# Patient Record
Sex: Female | Born: 1960 | Race: White | Hispanic: Yes | Marital: Married | Smoking: Former smoker
Health system: Southern US, Community
[De-identification: ages and names within clinical notes are randomized; demographics above are authoritative.]

## PROBLEM LIST (undated history)

## (undated) DIAGNOSIS — N898 Other specified noninflammatory disorders of vagina: Secondary | ICD-10-CM

## (undated) DIAGNOSIS — I10 Essential (primary) hypertension: Secondary | ICD-10-CM

## (undated) DIAGNOSIS — Z0001 Encounter for general adult medical examination with abnormal findings: Secondary | ICD-10-CM

## (undated) DIAGNOSIS — J302 Other seasonal allergic rhinitis: Secondary | ICD-10-CM

## (undated) DIAGNOSIS — R5383 Other fatigue: Secondary | ICD-10-CM

## (undated) DIAGNOSIS — I471 Supraventricular tachycardia, unspecified: Secondary | ICD-10-CM

## (undated) DIAGNOSIS — E039 Hypothyroidism, unspecified: Secondary | ICD-10-CM

## (undated) DIAGNOSIS — F329 Major depressive disorder, single episode, unspecified: Secondary | ICD-10-CM

## (undated) DIAGNOSIS — R002 Palpitations: Secondary | ICD-10-CM

## (undated) DIAGNOSIS — Z6838 Body mass index (BMI) 38.0-38.9, adult: Secondary | ICD-10-CM

## (undated) DIAGNOSIS — F988 Other specified behavioral and emotional disorders with onset usually occurring in childhood and adolescence: Secondary | ICD-10-CM

## (undated) DIAGNOSIS — R748 Abnormal levels of other serum enzymes: Secondary | ICD-10-CM

## (undated) DIAGNOSIS — R1310 Dysphagia, unspecified: Secondary | ICD-10-CM

## (undated) DIAGNOSIS — R070 Pain in throat: Secondary | ICD-10-CM

## (undated) DIAGNOSIS — I2510 Atherosclerotic heart disease of native coronary artery without angina pectoris: Secondary | ICD-10-CM

## (undated) DIAGNOSIS — K219 Gastro-esophageal reflux disease without esophagitis: Secondary | ICD-10-CM

## (undated) DIAGNOSIS — E785 Hyperlipidemia, unspecified: Secondary | ICD-10-CM

## (undated) DIAGNOSIS — L237 Allergic contact dermatitis due to plants, except food: Secondary | ICD-10-CM

## (undated) DIAGNOSIS — R1013 Epigastric pain: Secondary | ICD-10-CM

## (undated) DIAGNOSIS — Z6837 Body mass index (BMI) 37.0-37.9, adult: Secondary | ICD-10-CM

## (undated) DIAGNOSIS — K5900 Constipation, unspecified: Secondary | ICD-10-CM

## (undated) DIAGNOSIS — Z8669 Personal history of other diseases of the nervous system and sense organs: Secondary | ICD-10-CM

## (undated) DIAGNOSIS — R600 Localized edema: Secondary | ICD-10-CM

## (undated) DIAGNOSIS — R5381 Other malaise: Secondary | ICD-10-CM

## (undated) DIAGNOSIS — R300 Dysuria: Secondary | ICD-10-CM

## (undated) DIAGNOSIS — B009 Herpesviral infection, unspecified: Secondary | ICD-10-CM

## (undated) DIAGNOSIS — R32 Unspecified urinary incontinence: Secondary | ICD-10-CM

## (undated) HISTORY — DX: Personal history of other diseases of the nervous system and sense organs: Z86.69

## (undated) HISTORY — DX: Herpesviral infection, unspecified: B00.9

## (undated) HISTORY — DX: Abnormal levels of other serum enzymes: R74.8

## (undated) HISTORY — DX: Other specified behavioral and emotional disorders with onset usually occurring in childhood and adolescence: F98.8

## (undated) HISTORY — DX: Body mass index (BMI) 38.0-38.9, adult: Z68.38

## (undated) HISTORY — DX: Unspecified urinary incontinence: R32

## (undated) HISTORY — DX: Palpitations: R00.2

## (undated) HISTORY — DX: Dysphagia, unspecified: R13.10

## (undated) HISTORY — DX: Other seasonal allergic rhinitis: J30.2

## (undated) HISTORY — DX: Hyperlipidemia, unspecified: E78.5

## (undated) HISTORY — DX: Allergic contact dermatitis due to plants, except food: L23.7

## (undated) HISTORY — DX: Gastro-esophageal reflux disease without esophagitis: K21.9

## (undated) HISTORY — DX: Epigastric pain: R10.13

## (undated) HISTORY — DX: Supraventricular tachycardia, unspecified: I47.10

## (undated) HISTORY — PX: PALATE SURGERY: SHX729

## (undated) HISTORY — DX: Atherosclerotic heart disease of native coronary artery without angina pectoris: I25.10

## (undated) HISTORY — DX: Other malaise: R53.81

## (undated) HISTORY — DX: Body mass index (BMI) 37.0-37.9, adult: Z68.37

## (undated) HISTORY — DX: Supraventricular tachycardia: I47.1

## (undated) HISTORY — DX: Other specified noninflammatory disorders of vagina: N89.8

## (undated) HISTORY — DX: Pain in throat: R07.0

## (undated) HISTORY — DX: Other fatigue: R53.83

## (undated) HISTORY — DX: Encounter for general adult medical examination with abnormal findings: Z00.01

## (undated) HISTORY — DX: Essential (primary) hypertension: I10

## (undated) HISTORY — DX: Dysuria: R30.0

## (undated) HISTORY — PX: APPENDECTOMY: SHX54

## (undated) HISTORY — DX: Constipation, unspecified: K59.00

## (undated) HISTORY — DX: Major depressive disorder, single episode, unspecified: F32.9

## (undated) HISTORY — DX: Hypothyroidism, unspecified: E03.9

## (undated) HISTORY — DX: Localized edema: R60.0

---

## 1997-11-16 ENCOUNTER — Ambulatory Visit (HOSPITAL_BASED_OUTPATIENT_CLINIC_OR_DEPARTMENT_OTHER): Admission: RE | Admit: 1997-11-16 | Discharge: 1997-11-16 | Payer: Self-pay | Admitting: Urology

## 2001-04-18 ENCOUNTER — Other Ambulatory Visit: Admission: RE | Admit: 2001-04-18 | Discharge: 2001-04-18 | Payer: Self-pay | Admitting: Family Medicine

## 2004-09-19 ENCOUNTER — Ambulatory Visit (HOSPITAL_COMMUNITY): Admission: RE | Admit: 2004-09-19 | Discharge: 2004-09-19 | Payer: Self-pay | Admitting: Family Medicine

## 2005-07-02 ENCOUNTER — Ambulatory Visit (HOSPITAL_COMMUNITY): Admission: RE | Admit: 2005-07-02 | Discharge: 2005-07-02 | Payer: Self-pay | Admitting: Family Medicine

## 2005-07-05 ENCOUNTER — Ambulatory Visit (HOSPITAL_COMMUNITY): Admission: RE | Admit: 2005-07-05 | Discharge: 2005-07-05 | Payer: Self-pay | Admitting: Family Medicine

## 2005-08-23 ENCOUNTER — Ambulatory Visit: Payer: Self-pay | Admitting: Internal Medicine

## 2005-09-07 ENCOUNTER — Ambulatory Visit: Payer: Self-pay | Admitting: Internal Medicine

## 2005-09-26 ENCOUNTER — Encounter (INDEPENDENT_AMBULATORY_CARE_PROVIDER_SITE_OTHER): Payer: Self-pay | Admitting: *Deleted

## 2005-09-26 ENCOUNTER — Ambulatory Visit: Payer: Self-pay | Admitting: Internal Medicine

## 2005-09-26 ENCOUNTER — Ambulatory Visit (HOSPITAL_COMMUNITY): Admission: RE | Admit: 2005-09-26 | Discharge: 2005-09-26 | Payer: Self-pay | Admitting: Internal Medicine

## 2005-09-26 HISTORY — PX: ESOPHAGOGASTRODUODENOSCOPY: SHX1529

## 2005-09-26 HISTORY — PX: COLONOSCOPY: SHX174

## 2005-10-04 ENCOUNTER — Encounter (HOSPITAL_COMMUNITY): Admission: RE | Admit: 2005-10-04 | Discharge: 2005-11-03 | Payer: Self-pay | Admitting: Internal Medicine

## 2006-01-16 ENCOUNTER — Ambulatory Visit: Payer: Self-pay | Admitting: Internal Medicine

## 2006-08-01 ENCOUNTER — Ambulatory Visit: Payer: Self-pay | Admitting: Internal Medicine

## 2009-04-04 ENCOUNTER — Ambulatory Visit (HOSPITAL_COMMUNITY): Admission: RE | Admit: 2009-04-04 | Discharge: 2009-04-04 | Payer: Self-pay | Admitting: Family Medicine

## 2010-10-06 NOTE — Consult Note (Signed)
NAMESHYLA, Tammy Carter              ACCOUNT NO.:  [id number]   MEDICAL RECORD NO.:  [id number]           PATIENT TYPE:  AMB   LOCATION:                                FACILITY:  [facility]   PHYSICIAN:  Lionel December, M.D.    DATE OF BIRTH:  [birth year]   DATE OF CONSULTATION:  08/23/2005  DATE OF DISCHARGE:                                   CONSULTATION   REASON FOR CONSULTATION:  Epigastric pain.   PHYSICIAN REQUESTING CONSULTATION:  Butch Penny, MD.   HISTORY OF PRESENT ILLNESS:  Tammy Carter is a 50 year old Hispanic female who  presents today at the request of Dr. Butch Penny for further evaluation of  chronic epigastric pain.  She has had epigastric pain on and off for over 3  years.  Symptoms occur primarily at night.  She often wakes up with the  pain.  She had nausea but no vomiting.  She has been on Nexium for a few  months and has not noticed any improvement.  If she takes Tums or drinks  milk, she seems to have some relief with the pain.  She does have typical  heartburn symptoms.  She also has difficulty swallowing certain meats.  Sometimes they come back up.  Denies any hematemesis, melena, or rectal  bleeding.  She has intermittent alternating constipation and diarrhea which  she has had chronically.  She had an abdominal ultrasound which was normal  except for probable fatty infiltration of the liver.  Upper GI series was  unremarkable.  Stool studies were negative.   CURRENT MEDICATIONS:  1.  Procardia 30 mg daily.  2.  Levothroid 50 mcg daily.  3.  Claritin 10 mg daily as needed.   ALLERGIES:  NO KNOWN DRUG ALLERGIES.   PAST MEDICAL HISTORY:  1.  Hypothyroidism.  2.  Hypertension.   FAMILY HISTORY:  She had 1 sister who had some type of cancer, but they do  not know any details.   SOCIAL HISTORY:  She is married and has 5 children.  She is a Futures trader.  She has never been a smoker.  No alcohol use.   REVIEW OF SYSTEMS:  See HPI for GI.  CONSTITUTIONAL:  No  unintentional  weight loss.  GENITOURINARY:  She had regular menses.  CARDIOPULMONARY:  No  chest pain or shortness of breath.   PHYSICAL EXAMINATION:  VITAL SIGNS:  Weight 228, height 5 feet 5 inches,  temp 97.8, blood pressure 122/76, pulse 70.  GENERAL:  Pleasant, moderately obese Hispanic young female in no acute  distress.  She is accompanied by her husband.  SKIN:  Warm and dry.  No jaundice.  HEENT:  Conjunctivae are pink.  Sclerae nonicteric.  Oropharyngeal mucosa  moist and pink.  No lesions, erythema, or exudate.  No lymphadenopathy,  thyromegaly.  CHEST:  Lungs are clear to auscultation.  CARDIAC:  Exam reveals a regular rhythm.  Normal S1, S2.  No murmurs, rubs,  or gallops.  ABDOMEN:  Positive bowel sounds.  Soft, nondistended.  Mild epigastric  tenderness to deep palpation.  No organomegaly or masses.  No rebound  tenderness or guarding.  No abdominal bruits or hernias.  EXTREMITIES:  No edema.   IMPRESSION:  Tammy Carter is a 50 year old lady with a several year history of  intermittent epigastric pain.  She also has typical acid reflux symptoms.  She has postprandial epigastric pain.  She wakes up at night with epigastric  pain and acid reflux.  Suspect she having gastroesophageal reflux  disease/dyspepsia.  No evidence of acute cholecystitis or cholelithiasis on  recent abdominal ultrasound, but biliary dyskinesia cannot be excluded.  In  addition, she has solid food dysphagia.  She has not responded to PPI  therapy thus far.   PLAN:  1.  EGD with esophageal dilatation in the near future.  2.  We will switch her to Protonix 40 mg daily, #20 samples given.  3.  CBC, amylase, lipase, LFTs.  4.  Further recommendations to follow.   I would like to thank Dr. Butch Penny for allowing [place] to take part in the  care of this patient.      Tammy Carter, P.A.      Lionel December, M.D.  Electronically Signed    LL/MEDQ  D:  08/23/2005  T:  08/24/2005  Job:  [id number]    cc:   Angus G. Renard Matter, MD  Fax: [phone]

## 2010-10-06 NOTE — Op Note (Signed)
Tammy Carter, Tammy Carter              ACCOUNT NO.:  [id number]   MEDICAL RECORD NO.:  [id number]          PATIENT TYPE:  AMB   LOCATION:  DAY                           FACILITY:  [facility]   PHYSICIAN:  Lionel December, M.D.    DATE OF BIRTH:  01/29/1961   DATE OF PROCEDURE:  09/26/2005  DATE OF DISCHARGE:                                 OPERATIVE REPORT   PROCEDURE:  Colonoscopy followed by esophagogastroduodenoscopy with  esophageal dilation.   INDICATIONS FOR PROCEDURE:  Tammy Carter is a 50 year old Hispanic female with  recurrent epigastric pain who also complains of dysphagia. Recent ultrasound  was negative for cholelithiasis. She was noted to be anemic. Iron studies  confirmed she has iron deficiency anemia. She is therefore undergoing a  diagnostic evaluation. The procedure is reviewed with the patient and  informed consent was obtained.   MEDS FOR CONSCIOUS SEDATION:  Demerol 50 mg IV, Versed 12 mg IV, benzocaine  spray for oropharyngeal topical anesthesia.   PREOP MEDICATIONS:  Demerol 50 mg IV, Versed 7 mg IV in divided dose.   FINDINGS:  The procedure is performed in endoscopy suite. Colonoscopy was  performed first as the scope was being sterilized.   COLONOSCOPY:  The patient was placed in the left lateral decubitus position,  rectal examination performed. No abnormalities noted on external or digital  exam. The Pentax videoscope was placed in the rectum and advanced under  direct vision into the sigmoid colon and beyond. The preparation was  satisfactory. The scope was passed was in the cecum which was identified by  the appendiceal orifice and ileocecal valve. Pictures taken for the record.  As the scope was withdrawn, the colonic mucosa was carefully examined and  was normal throughout. The rectal mucosa similarly was normal. The scope was  retroflexed to examine the anorectal junction which was unremarkable. The  endoscope was straightened and withdrawn. The patient prepared  for procedure  #2.   ESOPHAGOGASTRODUODENOSCOPY:  The Pentax videoscope was passed through  oropharynx without any difficulty into esophagus.   Mucosa of the esophagus normal throughout. GE junction was at 37 cm and no  ring or stricture was noted.   STOMACH:  It was empty and distended very well with insufflation. The folds  of the proximal stomach were normal. Examination of the mucosa at body,  antrum, pyloric channel as well as angularis, fundus and cardia no ring or  stricture was noted.   STOMACH:  It was empty and distended very well with insufflation. The folds  of the proximal stomach were normal. There were multiple tiny polyps in the  gastric body, three of which were biopsied for histology. Endoscopically  these appeared to be hyperplastic. Antral and pyloric channel mucosa were  normal. Angularis, fundus and cardia were examined by retroflexing scope and  were normal.   DUODENUM:  The bulbar mucosa was normal. The scope was passed to the second  part of the duodenum where the mucosa and folds were normal.   The endoscope was withdrawn.   The esophagus was dilated by passing a 56 [place] Maloney dilator to full  insertion. As the dilator was withdrawn, the endoscope was passed again and  there was no mucosal disruption noted to the esophagus. The endoscope was  withdrawn. The patient tolerated the procedures well.   FINAL DIAGNOSES:  Small gastric polyps at body otherwise normal  esophagogastroduodenoscopy. Three of these polyps were biopsied for  histology. Endoscopically these appeared to be hyperplastic.   Esophagus dilated by passing 56 French Maloney dilator given history of  dysphagia.   Normal colonoscopy.   PLAN:  1.  Ferrous sulfate 325 mg p.o. b.i.d. with meals.  2.  Hepatobiliary scan with CCK.      Lionel December, M.D.  Electronically Signed     NR/MEDQ  D:  09/26/2005  T:  09/27/2005  Job:  [id number]   cc:   Angus G. Renard Matter, MD  Fax:  [phone]

## 2011-05-01 ENCOUNTER — Ambulatory Visit (HOSPITAL_COMMUNITY)
Admission: RE | Admit: 2011-05-01 | Discharge: 2011-05-01 | Disposition: A | Discharge status: Home / Self Care | Payer: 59 | Source: Ambulatory Visit | Attending: Family Medicine | Admitting: Family Medicine

## 2011-05-01 ENCOUNTER — Other Ambulatory Visit (HOSPITAL_COMMUNITY): Payer: Self-pay | Admitting: Family Medicine

## 2011-05-01 DIAGNOSIS — M25579 Pain in unspecified ankle and joints of unspecified foot: Secondary | ICD-10-CM

## 2011-05-01 DIAGNOSIS — M25473 Effusion, unspecified ankle: Secondary | ICD-10-CM | POA: Insufficient documentation

## 2011-05-01 DIAGNOSIS — I8390 Asymptomatic varicose veins of unspecified lower extremity: Secondary | ICD-10-CM

## 2011-05-01 DIAGNOSIS — M7989 Other specified soft tissue disorders: Secondary | ICD-10-CM

## 2011-05-01 DIAGNOSIS — M25476 Effusion, unspecified foot: Secondary | ICD-10-CM | POA: Insufficient documentation

## 2011-05-03 ENCOUNTER — Ambulatory Visit (HOSPITAL_COMMUNITY)
Admission: RE | Admit: 2011-05-03 | Discharge: 2011-05-03 | Disposition: A | Discharge status: Home / Self Care | Payer: 59 | Source: Ambulatory Visit | Attending: Family Medicine | Admitting: Family Medicine

## 2011-05-03 ENCOUNTER — Other Ambulatory Visit (HOSPITAL_BASED_OUTPATIENT_CLINIC_OR_DEPARTMENT_OTHER): Payer: Self-pay | Admitting: Internal Medicine

## 2011-05-03 DIAGNOSIS — M79609 Pain in unspecified limb: Secondary | ICD-10-CM | POA: Insufficient documentation

## 2011-05-03 DIAGNOSIS — I8390 Asymptomatic varicose veins of unspecified lower extremity: Secondary | ICD-10-CM

## 2011-05-03 DIAGNOSIS — M25579 Pain in unspecified ankle and joints of unspecified foot: Secondary | ICD-10-CM

## 2011-05-03 DIAGNOSIS — M7989 Other specified soft tissue disorders: Secondary | ICD-10-CM | POA: Insufficient documentation

## 2011-11-01 ENCOUNTER — Other Ambulatory Visit (HOSPITAL_COMMUNITY): Payer: Self-pay | Admitting: Family Medicine

## 2011-11-01 DIAGNOSIS — Z139 Encounter for screening, unspecified: Secondary | ICD-10-CM

## 2011-11-06 ENCOUNTER — Ambulatory Visit (HOSPITAL_COMMUNITY)
Admission: RE | Admit: 2011-11-06 | Discharge: 2011-11-06 | Disposition: A | Discharge status: Home / Self Care | Payer: 59 | Source: Ambulatory Visit | Attending: Family Medicine | Admitting: Family Medicine

## 2011-11-06 DIAGNOSIS — Z1231 Encounter for screening mammogram for malignant neoplasm of breast: Secondary | ICD-10-CM | POA: Insufficient documentation

## 2011-11-06 DIAGNOSIS — Z139 Encounter for screening, unspecified: Secondary | ICD-10-CM

## 2013-02-18 ENCOUNTER — Encounter: Payer: Self-pay | Admitting: Internal Medicine

## 2013-02-23 ENCOUNTER — Ambulatory Visit (INDEPENDENT_AMBULATORY_CARE_PROVIDER_SITE_OTHER): Payer: 59 | Admitting: Gastroenterology

## 2013-02-23 ENCOUNTER — Encounter: Payer: Self-pay | Admitting: Gastroenterology

## 2013-02-23 VITALS — BP 135/82 | HR 56 | Temp 97.4°F | Ht 65.0 in | Wt 231.4 lb

## 2013-02-23 DIAGNOSIS — K5900 Constipation, unspecified: Secondary | ICD-10-CM

## 2013-02-23 DIAGNOSIS — R1013 Epigastric pain: Secondary | ICD-10-CM

## 2013-02-23 DIAGNOSIS — K3189 Other diseases of stomach and duodenum: Secondary | ICD-10-CM

## 2013-02-23 DIAGNOSIS — R1310 Dysphagia, unspecified: Secondary | ICD-10-CM

## 2013-02-23 HISTORY — DX: Dysphagia, unspecified: R13.10

## 2013-02-23 HISTORY — DX: Epigastric pain: R10.13

## 2013-02-23 HISTORY — DX: Constipation, unspecified: K59.00

## 2013-02-23 MED ORDER — PANTOPRAZOLE SODIUM 40 MG PO TBEC: 40.0000 mg | DELAYED_RELEASE_TABLET | Freq: Every day | ORAL | Status: DC

## 2013-02-23 MED ORDER — LINACLOTIDE 145 MCG PO CAPS: 145.0000 ug | ORAL_CAPSULE | Freq: Every day | ORAL | Status: DC

## 2013-02-23 NOTE — Patient Instructions (Addendum)
We have scheduled you for an upper endoscopy and possible stretching of your esophagus in the near future with Dr. Jena Gauss.  Start taking Protonix (for reflux) each morning, 30 minutes before breakfast.   For constipation: I have provided a voucher for a medication called Linzess. Take this on an empty stomach, 30 minutes before breakfast.

## 2013-02-23 NOTE — Progress Notes (Signed)
Primary Care Physician:  Alice Reichert, MD Primary Gastroenterologist:  Dr. Jena Gauss   Chief Complaint  Patient presents with  . Abdominal Pain  . Bloated  . Dysphagia    at times    HPI:   Tammy Carter presents today as a self-referral. We last saw her in 2007, where she underwent an EGD with empiric dilation by Dr. Karilyn Cota. Gastric polyps noted at the time and were benign. Colonoscopy normal. She underwent an [place] of abd and HIDA scan to assess for a biliary component. US showed fatty liver, HIDA with 87% EF.  Husband present. Notes 10-12 month history of recurrent indigestion/burning, intermittent dysphagia with meat. No pill dysphagia. Upper abdominal discomfort, also vague lower abdominal discomfort. Aggravated by eating. Associated nausea with the discomfort, dyspepsia. Drinks soy milk when feels the discomfort, with some relief/settling of stomach. Avoiding greasy foods helps. Abdominal bloating, intermittent. 2-3 times per week. BM twice a day, no diarrhea. Not productive. Occasional constipation every now and then. No rectal bleeding. No melena. Denies weight loss or lack of appetite.   Past Medical History  Diagnosis Date  . GERD (gastroesophageal reflux disease)   . Hypothyroidism   . Hypertension     Past Surgical History  Procedure Laterality Date  . Colonoscopy   09/26/2005    WUX:LKGMWN colonoscopy  . Esophagogastroduodenoscopy  09/26/2005    NUR: Small gastric polyps at body otherwise normal esophagogastroduodenoscopy. Three of these polyps were biopsied for  histology. Endoscopically these appeared to be hyperplastic/ Esophagus dilated by passing 56 French Maloney dilator, empiric. benign path    Current Outpatient Prescriptions  Medication Sig Dispense Refill  . ferrous sulfate 325 (65 FE) MG tablet Take 325 mg by mouth daily with breakfast.      . levothyroxine (SYNTHROID, LEVOTHROID) 50 MCG tablet Take 50 mcg by mouth daily before breakfast.      .  NIFEdipine (PROCARDIA-XL/ADALAT-CC/NIFEDICAL-XL) 30 MG 24 hr tablet Take 30 mg by mouth daily.       No current facility-administered medications for this visit.    Allergies as of 02/23/2013  . (No Known Allergies)    Family History  Problem Relation Age of Onset  . Colon cancer Neg Hx     History   Social History  . Marital Status: Married    Spouse Name: N/A    Number of Children: 5  . Years of Education: N/A   Occupational History  . Not on file.   Social History Main Topics  . Smoking status: Never Smoker   . Smokeless tobacco: Not on file  . Alcohol Use: No  . Drug Use: No  . Sexual Activity: Not on file   Other Topics Concern  . Not on file   Social History Narrative  . No narrative on file    Review of Systems: Gen: Denies any fever, chills, fatigue, weight loss, lack of appetite.  CV: Denies chest pain, heart palpitations, peripheral edema, syncope.  Resp: Denies shortness of breath at rest or with exertion. Denies wheezing or cough.  GI: see HPI GU : Denies urinary burning, urinary frequency, urinary hesitancy MS: right knee "popping" /pain Derm: Denies rash, itching, dry skin Psych: Denies depression, anxiety, memory loss, and confusion Heme: Denies bruising, bleeding, and enlarged lymph nodes.  Physical Exam: BP 135/82  Pulse 56  Temp(Src) 97.4 F (36.3 C) (Oral)  Ht 5\' 5"  (1.651 m)  Wt 231 lb 6.4 oz (104.962 kg)  BMI 38.51 kg/m2  LMP 02/23/2013 General:  Alert and oriented. Pleasant and cooperative. Well-nourished and well-developed.  Head:  Normocephalic and atraumatic. Eyes:  Without icterus, sclera clear and conjunctiva pink.  Ears:  Normal auditory acuity. Nose:  No deformity, discharge,  or lesions. Mouth:  No deformity or lesions, oral mucosa pink.  Neck:  Supple, without mass or thyromegaly. Lungs:  Clear to auscultation bilaterally. No wheezes, rales, or rhonchi. No distress.  Heart:  S1, S2 present without murmurs appreciated.   Abdomen:  +BS, soft, non-tender and non-distended. No HSM noted. No guarding or rebound. No masses appreciated.  Rectal:  Deferred  Msk:  Symmetrical without gross deformities. Normal posture. Extremities:  Without clubbing or edema. Neurologic:  Alert and  oriented x4;  grossly normal neurologically. Skin:  Intact without significant lesions or rashes. Cervical Nodes:  No significant cervical adenopathy. Psych:  Alert and cooperative. Normal mood and affect.

## 2013-02-24 ENCOUNTER — Encounter (HOSPITAL_COMMUNITY): Payer: Self-pay | Admitting: Pharmacy Technician

## 2013-02-24 NOTE — Assessment & Plan Note (Signed)
52 year old female with almost a year-long history of severe reflux, nausea, epigastric discomfort, and bloating. Query gastritis, less likely PUD, doubt celiac disease, unable to exclude biliary component. Start Protonix now and schedule for EGD in near future with Dr. Jena Gauss. Will need dilation at time of EGD due to intermittent solid-food dysphagia.   Proceed with upper endoscopy/dilation in the near future with Dr. Jena Gauss. The risks, benefits, and alternatives have been discussed in detail with patient. They have stated understanding and desire to proceed.  I have asked for an interpreter to be present due to patient's limited [place]. Husband translated today as interpreter not available.  [place] of abdomen in future if negative EGD.

## 2013-02-24 NOTE — Assessment & Plan Note (Signed)
Empiric dilation in 2007. Query occult web, ring, or stricture versus uncontrolled GERD. Start Protonix and EGD/ED in near future.

## 2013-02-24 NOTE — Assessment & Plan Note (Signed)
Likely contributing to bloating symptoms; start Linzess 145 mcg daily. Colonoscopy up-to-date as of 2007; due for screening in 2017.

## 2013-02-25 ENCOUNTER — Telehealth: Payer: Self-pay | Admitting: Gastroenterology

## 2013-02-25 NOTE — Telephone Encounter (Signed)
Patients husband called and stated that Tammy Carter wants to cancel EGD/ED for now she wants to give medications time to work first.

## 2013-02-25 NOTE — Progress Notes (Signed)
CC'd to PCP 

## 2013-02-25 NOTE — Telephone Encounter (Signed)
Noted.  Please have her return in about 6-8 weeks.

## 2013-02-26 ENCOUNTER — Ambulatory Visit (HOSPITAL_COMMUNITY): Admission: RE | Admit: 2013-02-26 | Payer: 59 | Source: Ambulatory Visit | Admitting: Internal Medicine

## 2013-02-26 ENCOUNTER — Encounter (HOSPITAL_COMMUNITY): Admission: RE | Payer: Self-pay | Source: Ambulatory Visit

## 2013-02-26 SURGERY — ESOPHAGOGASTRODUODENOSCOPY (EGD) WITH ESOPHAGEAL DILATION
Anesthesia: Moderate Sedation

## 2013-02-27 NOTE — Telephone Encounter (Signed)
Pt is aware of OV on 12/1 at 145 with AS and appt card was mailed

## 2013-03-02 ENCOUNTER — Encounter: Payer: Self-pay | Admitting: Gastroenterology

## 2013-04-20 ENCOUNTER — Ambulatory Visit: Payer: 59 | Admitting: Gastroenterology

## 2013-07-27 ENCOUNTER — Other Ambulatory Visit (HOSPITAL_COMMUNITY): Payer: Self-pay | Admitting: Family Medicine

## 2013-07-27 DIAGNOSIS — Z139 Encounter for screening, unspecified: Secondary | ICD-10-CM

## 2013-07-27 DIAGNOSIS — M810 Age-related osteoporosis without current pathological fracture: Secondary | ICD-10-CM

## 2013-07-30 ENCOUNTER — Ambulatory Visit (HOSPITAL_COMMUNITY)
Admission: RE | Admit: 2013-07-30 | Discharge: 2013-07-30 | Disposition: A | Discharge status: Home / Self Care | Payer: 59 | Source: Ambulatory Visit | Attending: Family Medicine | Admitting: Family Medicine

## 2013-07-30 DIAGNOSIS — Z1231 Encounter for screening mammogram for malignant neoplasm of breast: Secondary | ICD-10-CM | POA: Insufficient documentation

## 2013-07-30 DIAGNOSIS — Z139 Encounter for screening, unspecified: Secondary | ICD-10-CM

## 2013-07-31 ENCOUNTER — Ambulatory Visit (HOSPITAL_COMMUNITY)
Admission: RE | Admit: 2013-07-31 | Discharge: 2013-07-31 | Disposition: A | Discharge status: Home / Self Care | Payer: 59 | Source: Ambulatory Visit | Attending: Family Medicine | Admitting: Family Medicine

## 2013-07-31 DIAGNOSIS — M810 Age-related osteoporosis without current pathological fracture: Secondary | ICD-10-CM

## 2013-07-31 DIAGNOSIS — Z13820 Encounter for screening for osteoporosis: Secondary | ICD-10-CM | POA: Insufficient documentation

## 2016-05-07 ENCOUNTER — Other Ambulatory Visit (HOSPITAL_COMMUNITY): Payer: Self-pay | Admitting: Internal Medicine

## 2016-05-07 DIAGNOSIS — E049 Nontoxic goiter, unspecified: Secondary | ICD-10-CM

## 2016-06-29 ENCOUNTER — Other Ambulatory Visit (HOSPITAL_COMMUNITY): Payer: Self-pay | Admitting: Internal Medicine

## 2016-06-29 DIAGNOSIS — E049 Nontoxic goiter, unspecified: Secondary | ICD-10-CM

## 2016-06-29 DIAGNOSIS — R946 Abnormal results of thyroid function studies: Secondary | ICD-10-CM

## 2016-07-06 ENCOUNTER — Ambulatory Visit (HOSPITAL_COMMUNITY)
Admission: RE | Admit: 2016-07-06 | Discharge: 2016-07-06 | Disposition: A | Discharge status: Home / Self Care | Payer: BLUE CROSS/BLUE SHIELD | Source: Ambulatory Visit | Attending: Internal Medicine | Admitting: Internal Medicine

## 2016-07-06 DIAGNOSIS — E049 Nontoxic goiter, unspecified: Secondary | ICD-10-CM

## 2016-07-06 DIAGNOSIS — R946 Abnormal results of thyroid function studies: Secondary | ICD-10-CM

## 2016-07-16 ENCOUNTER — Other Ambulatory Visit (HOSPITAL_COMMUNITY): Payer: Self-pay | Admitting: Internal Medicine

## 2016-07-16 DIAGNOSIS — G44329 Chronic post-traumatic headache, not intractable: Secondary | ICD-10-CM

## 2016-07-18 ENCOUNTER — Ambulatory Visit (HOSPITAL_COMMUNITY)
Admission: RE | Admit: 2016-07-18 | Discharge: 2016-07-18 | Disposition: A | Discharge status: Home / Self Care | Payer: BLUE CROSS/BLUE SHIELD | Source: Ambulatory Visit | Attending: Internal Medicine | Admitting: Internal Medicine

## 2016-07-18 DIAGNOSIS — G44329 Chronic post-traumatic headache, not intractable: Secondary | ICD-10-CM

## 2016-07-18 DIAGNOSIS — R9082 White matter disease, unspecified: Secondary | ICD-10-CM | POA: Insufficient documentation

## 2016-07-18 DIAGNOSIS — G44309 Post-traumatic headache, unspecified, not intractable: Secondary | ICD-10-CM | POA: Diagnosis present

## 2016-10-02 ENCOUNTER — Other Ambulatory Visit (HOSPITAL_COMMUNITY): Payer: Self-pay | Admitting: Internal Medicine

## 2016-10-02 DIAGNOSIS — Z1231 Encounter for screening mammogram for malignant neoplasm of breast: Secondary | ICD-10-CM

## 2016-10-18 ENCOUNTER — Encounter (HOSPITAL_COMMUNITY): Payer: Self-pay

## 2016-10-18 ENCOUNTER — Ambulatory Visit (HOSPITAL_COMMUNITY): Payer: BLUE CROSS/BLUE SHIELD

## 2017-08-28 IMAGING — CT CT HEAD W/O CM
4 series · 17 of 47 positions shown, 19 images · non-contrast
Comparison: None.

CLINICAL DATA: Chronic posttraumatic headache.

EXAM:
CT HEAD WITHOUT CONTRAST
TECHNIQUE: Contiguous axial images were obtained from the base of the skull
through the vertex without intravenous contrast.

[Series 2: head without · axial · non-contrast · 0.43mm/px · z∈[+1376,+1496]mm · 7 of 34 slices shown, 9 images]
[im 5/34  brain]
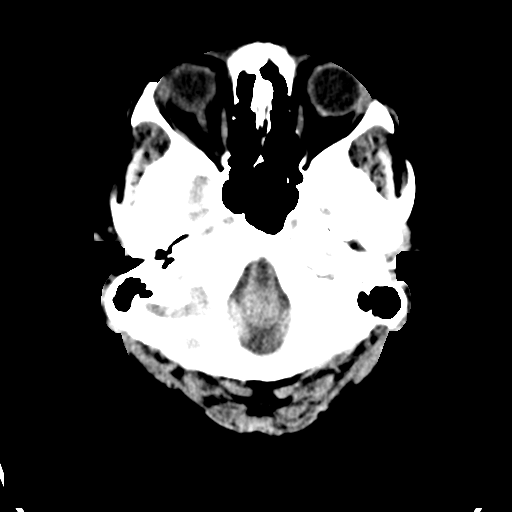
[im 5/34  bone]
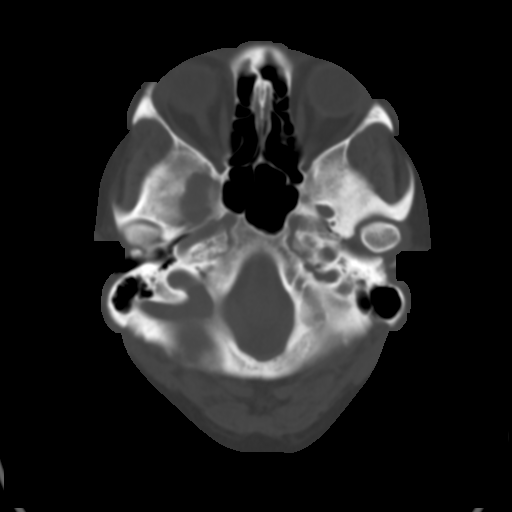
[im 9/34  brain]
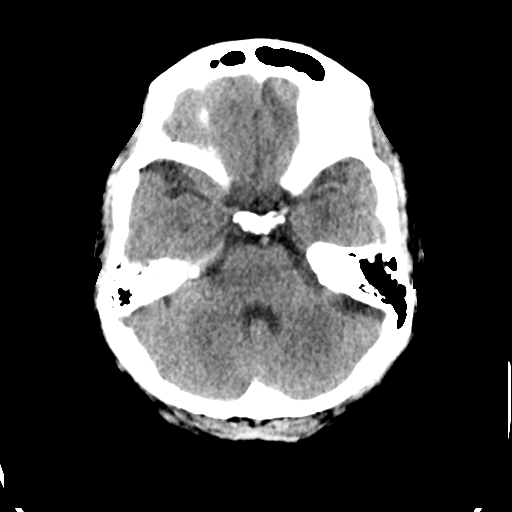
[im 13/34  brain]
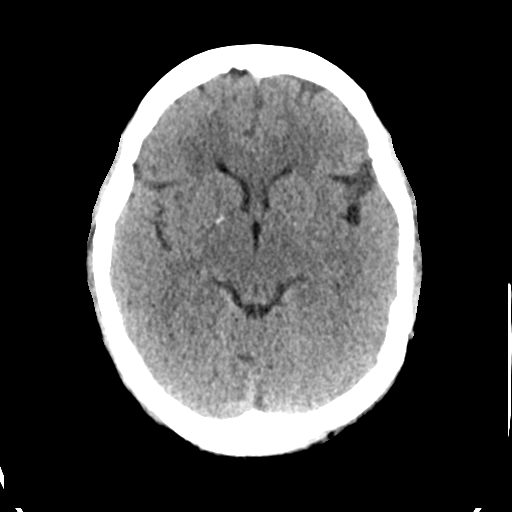
[im 17/34  brain]
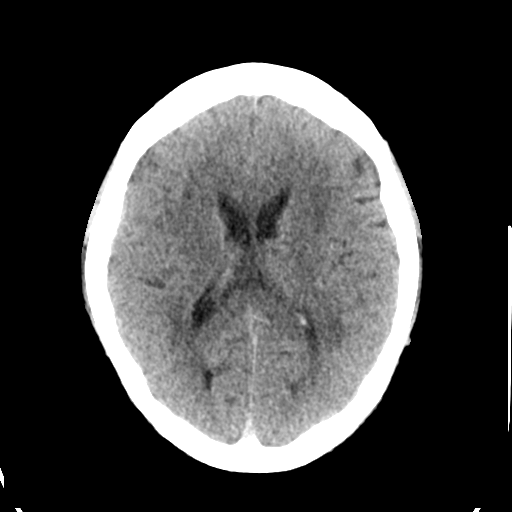
[im 21/34  brain]
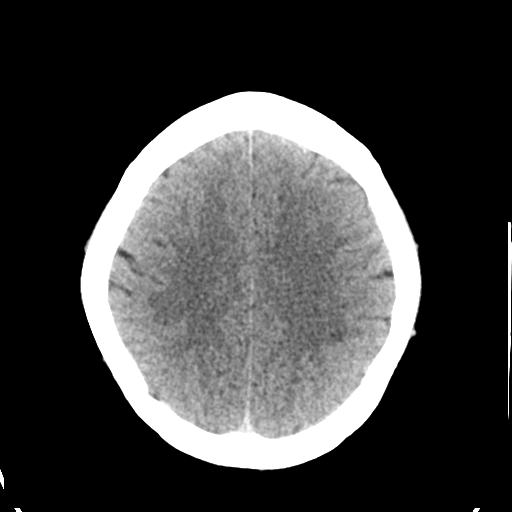
[im 21/34  bone]
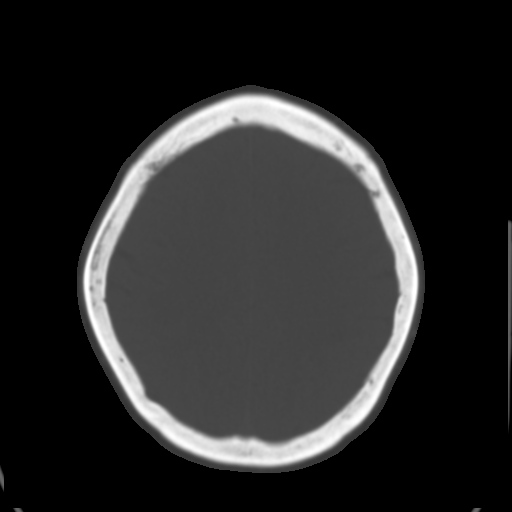
[im 25/34  brain]
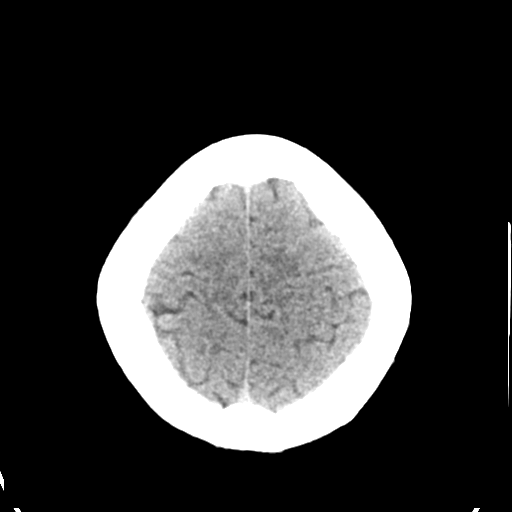
[im 29/34  brain]
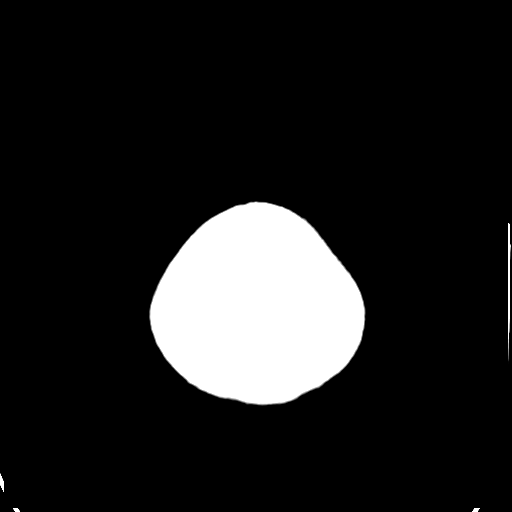

[Series 3: head bone · axial · 0.43mm/px · z∈[+1372,+1430]mm · 4 of 85 slices shown]
[im 9/85  bone]
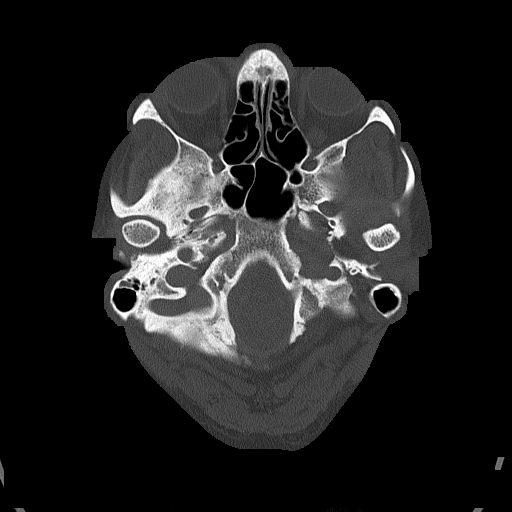
[im 17/85  bone]
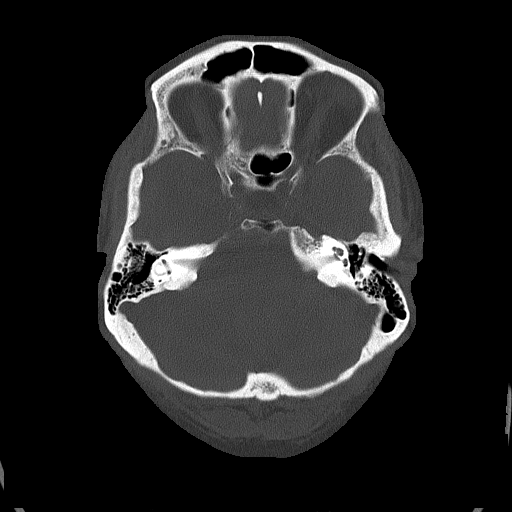
[im 26/85  bone]
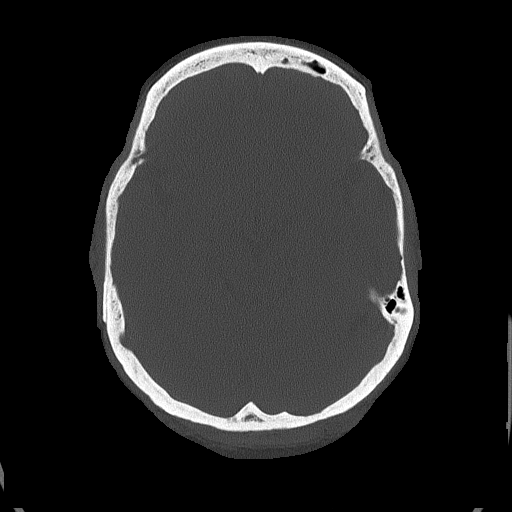
[im 38/85  bone]
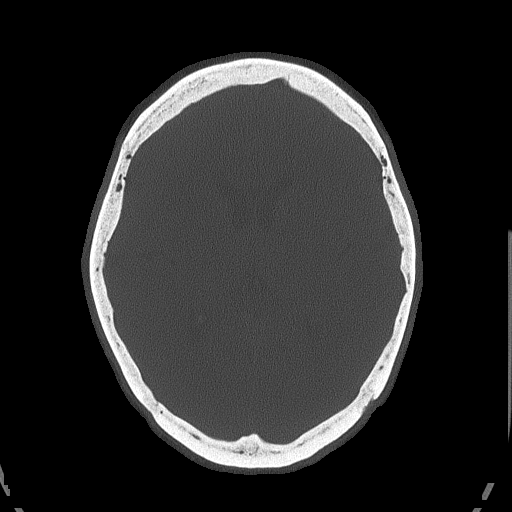

[Series 4: head without cor · coronal · non-contrast · 0.33mm/px · 3 of 70 slices shown]
[im 24/70  brain]
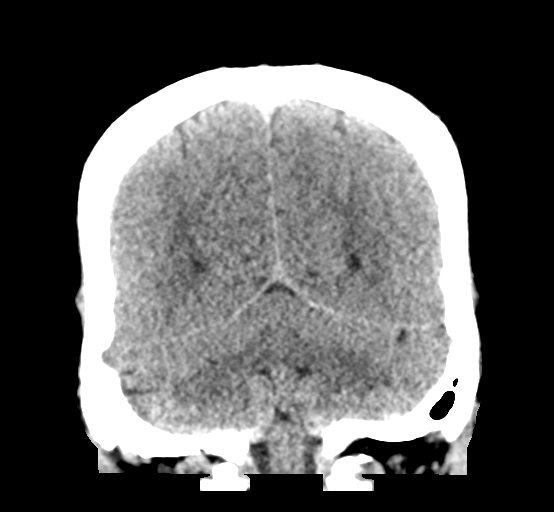
[im 31/70  brain]
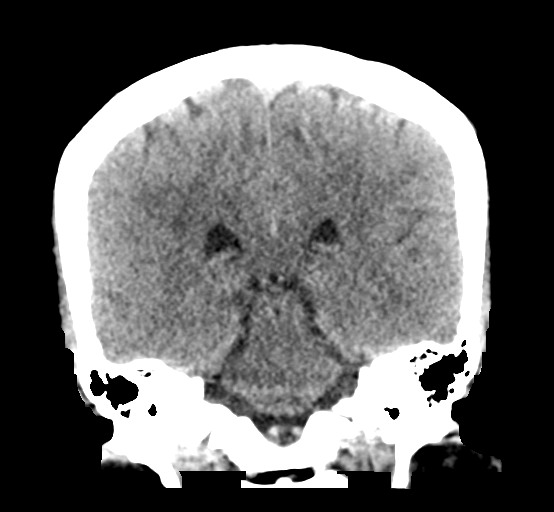
[im 39/70  brain]
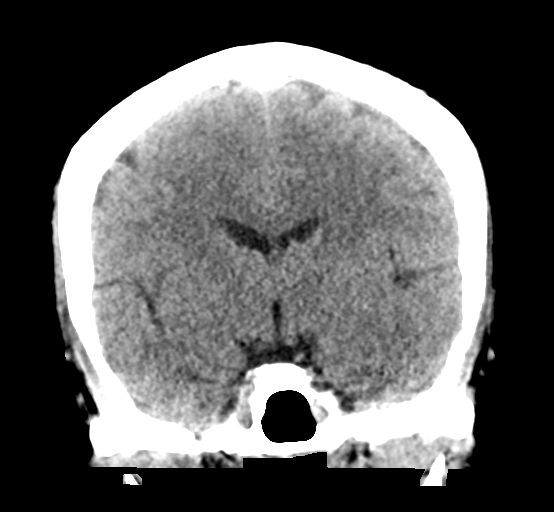

[Series 5: head without sag · sagittal · non-contrast · 0.33mm/px · 3 of 61 slices shown]
[im 21/61  brain]
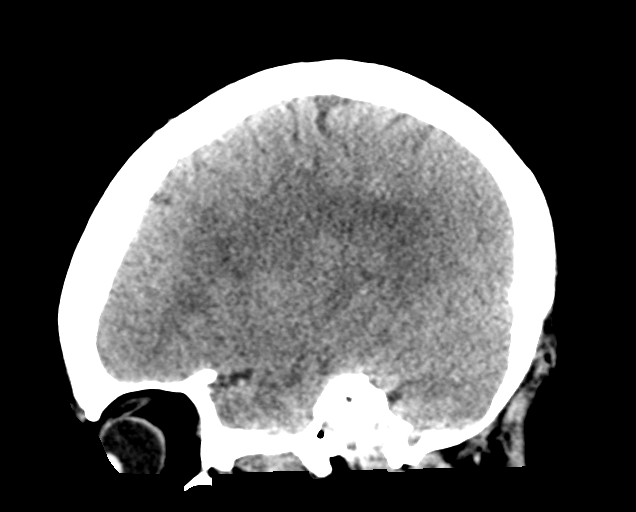
[im 31/61  brain]
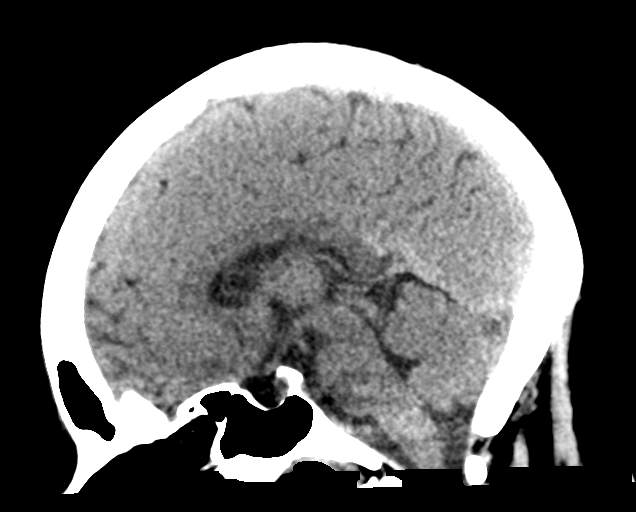
[im 41/61  brain]
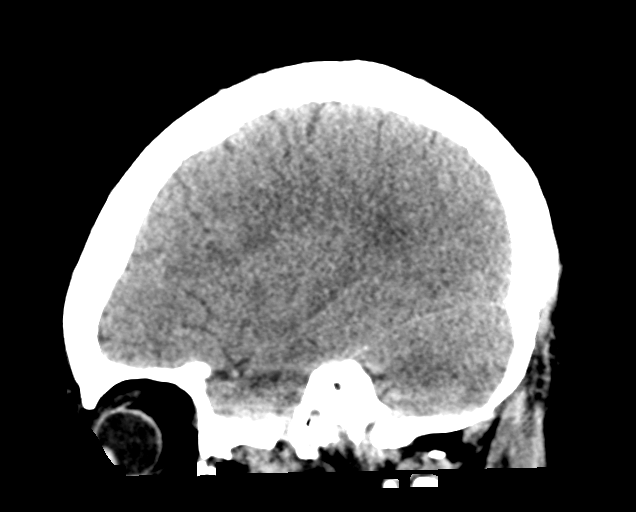

[17 of 47 positions shown; findings below may reference images not displayed]

FINDINGS: Brain: Mild chronic ischemic white matter disease is noted. No mass
effect or midline shift is noted. Ventricular size is within normal
limits. There is no evidence of mass lesion, hemorrhage or acute
infarction.

Vascular: No hyperdense vessel or unexpected calcification.

Skull: Normal. Negative for fracture or focal lesion.

Sinuses/Orbits: No acute finding.

Other: None.
IMPRESSION: Mild chronic ischemic white matter disease. No acute intracranial
abnormality seen.

## 2017-12-02 ENCOUNTER — Other Ambulatory Visit: Payer: Self-pay | Admitting: Internal Medicine

## 2017-12-02 ENCOUNTER — Other Ambulatory Visit (HOSPITAL_COMMUNITY): Payer: Self-pay | Admitting: Internal Medicine

## 2017-12-02 DIAGNOSIS — E0590 Thyrotoxicosis, unspecified without thyrotoxic crisis or storm: Secondary | ICD-10-CM

## 2017-12-04 ENCOUNTER — Ambulatory Visit (HOSPITAL_COMMUNITY): Payer: BLUE CROSS/BLUE SHIELD

## 2018-01-09 ENCOUNTER — Ambulatory Visit: Payer: Self-pay | Admitting: "Endocrinology

## 2018-02-18 ENCOUNTER — Ambulatory Visit: Payer: BLUE CROSS/BLUE SHIELD

## 2018-04-01 NOTE — Progress Notes (Signed)
Cardiology Office Note   Date:  04/07/2018   ID:  Tammy Carter, DOB 09-06-1960, MRN [id number]  PCP:  Benita Stabile, MD  Cardiologist:   Charlton Haws, MD   No chief complaint on file.     History of Present Illness: Tammy Carter is a 57 y.o. female who presents for consultation regarding poorly controlled HTN and chest pain .   Referred by Dr Margo Aye Also has history of GERD and hypothyroidism She is from [place] 5 children only one 64 yo at home. She does walk daily. Only taking norvasc and zestril for BP Has atypical Chest pain. Worse when BP up fleeting sharp left sided. Not always related to walking Can occur at rest Lasts minutes Has had for months No history of CAD    Past Medical History:  Diagnosis Date  . Abnormal levels of other serum enzymes   . ADD (attention deficit disorder)   . Allergic contact dermatitis due to plants, except food   . Body mass index 37.0-37.9, adult   . Body mass index 38.0-38.9, adult   . CAD (coronary artery disease)   . Depression, major   . Dyspepsia 02/23/2013  . Dysphagia, unspecified(787.20) 02/23/2013  . Dysuria   . Encounter for general adult medical examination with abnormal findings   . Essential (primary) hypertension   . GERD (gastroesophageal reflux disease)   . HSV infection   . Hx of migraines   . Hyperlipidemia   . Hypertension   . Hypothyroidism   . Localized edema   . Other fatigue   . Other malaise   . Other seasonal allergic rhinitis   . Other specified noninflammatory disorders of vagina   . Pain in throat   . Palpitations   . SVT (supraventricular tachycardia) (HCC)   . Unspecified constipation 02/23/2013  . Unspecified urinary incontinence     Past Surgical History:  Procedure Laterality Date  . APPENDECTOMY    . COLONOSCOPY   09/26/2005   JYN:WGNFAO colonoscopy  . ESOPHAGOGASTRODUODENOSCOPY  09/26/2005   NUR: Small gastric polyps at body otherwise normal esophagogastroduodenoscopy. Three of these  polyps were biopsied for  histology. Endoscopically these appeared to be hyperplastic/ Esophagus dilated by passing 56 French Maloney dilator, empiric. benign path  . PALATE SURGERY     EXPANSION     Current Outpatient Medications  Medication Sig Dispense Refill  . amLODipine (NORVASC) 2.5 MG tablet Take 2.5 mg by mouth daily.    . Calcium-Magnesium-Vitamin D (CALCIUM 500 PO) Take 1 tablet by mouth daily.    . ferrous sulfate 325 (65 FE) MG tablet Take 325 mg by mouth daily with breakfast.    . lisinopril (PRINIVIL,ZESTRIL) 5 MG tablet Take 5 mg by mouth daily.    . Multiple Vitamins-Minerals (VITAMIN D3 COMPLETE PO) Take 1 tablet by mouth daily.    . Multiple Vitamins-Minerals (WOMENS MULTIVITAMIN PO) Take by mouth daily.     No current facility-administered medications for this visit.     Allergies:   Atorvastatin    Social History:  The patient  reports that she quit smoking about 39 years ago. Her smoking use included cigarettes. She has never used smokeless tobacco. She reports that she drinks about 1.0 - 2.0 standard drinks of alcohol per week. She reports that she does not use drugs.   Family History:  The patient's family history includes AAA (abdominal aortic aneurysm) in her father; Aneurysm in her father; CAD in her father and mother; Cancer (  age of onset: 2660) in her paternal grandmother; Diabetes type II in her mother; Healthy in her daughter and daughter; Heart Problems in her sister; Heart attack (age of onset: 6062) in her mother; Heart disease in her maternal grandmother and mother; High Cholesterol in her sister; Hypertension in her father, mother, and sister; Muscular dystrophy in her brother; Valvular heart disease in her father.    ROS:  Please see the history of present illness.   Otherwise, review of systems are positive for none.   All other systems are reviewed and negative.    PHYSICAL EXAM: VS:  BP (!) 156/88   Pulse 68   Ht 5\' 5"  (1.651 m)   Wt 227 lb 12 oz  (103.3 kg)   SpO2 98%   BMI 37.90 kg/m  , BMI Body mass index is 37.9 kg/m. Affect appropriate Overweight [place] Female  HEENT: normal Neck supple with no adenopathy JVP normal bilateral bruits no thyromegaly Lungs clear with no wheezing and good diaphragmatic motion Heart:  S1/S2 SEM  murmur, no rub, gallop or click PMI normal Abdomen: benighn, BS positve, no tenderness, no AAA no bruit.  No HSM or HJR Distal pulses intact with no bruits No edema Neuro non-focal Skin warm and dry No muscular weakness    EKG:  NSR nonspecific ST changes    Recent Labs: No results found for requested labs within last 8760 hours.    Lipid Panel No results found for: CHOL, TRIG, HDL, CHOLHDL, VLDL, LDLCALC, LDLDIRECT    Wt Readings from Last 3 Encounters:  04/07/18 227 lb 12 oz (103.3 kg)  02/23/13 231 lb 6.4 oz (105 kg)      Other studies Reviewed: Additional studies/ records that were reviewed today include: Notes primary labs .    ASSESSMENT AND PLAN:  1. Chest Pain:  Atypical risk factors f/u Ex Myovue 2. HTN: Well controlled.  Continue current medications and low sodium Dash type diet.   3. Bruit: bilateral carotid vs referred murmur f/u duplex  4. Murmur:  AV area f/u TTE   Current medicines are reviewed at length with the patient today.  The patient does not have concerns regarding medicines.  The following changes have been made:  no change  Labs/ tests ordered today include: TTE, Carotid Duplex Ex Myovue   Orders Placed This Encounter  Procedures  . NM Myocar Multi W/Spect W/Wall Motion / EF  . US Carotid Duplex Bilateral  . EKG 12-Lead  . ECHOCARDIOGRAM COMPLETE     Disposition:   FU with cardiology in a year if [place] ok in [place]      Signed, Charlton HawsPeter Nishan, MD  04/07/2018 11:32 AM    [facility] [address], [place], [place]  [address] Phone: [phone]) [phone]; Fax: [phone]

## 2018-04-07 ENCOUNTER — Encounter: Payer: Self-pay | Admitting: Cardiovascular Disease

## 2018-04-07 ENCOUNTER — Encounter (INDEPENDENT_AMBULATORY_CARE_PROVIDER_SITE_OTHER): Payer: Self-pay

## 2018-04-07 ENCOUNTER — Ambulatory Visit (INDEPENDENT_AMBULATORY_CARE_PROVIDER_SITE_OTHER): Payer: BLUE CROSS/BLUE SHIELD | Admitting: Cardiovascular Disease

## 2018-04-07 VITALS — BP 156/88 | HR 68 | Ht 65.0 in | Wt 227.8 lb

## 2018-04-07 DIAGNOSIS — I10 Essential (primary) hypertension: Secondary | ICD-10-CM

## 2018-04-07 DIAGNOSIS — R079 Chest pain, unspecified: Secondary | ICD-10-CM

## 2018-04-07 DIAGNOSIS — R0989 Other specified symptoms and signs involving the circulatory and respiratory systems: Secondary | ICD-10-CM | POA: Diagnosis not present

## 2018-04-07 DIAGNOSIS — R011 Cardiac murmur, unspecified: Secondary | ICD-10-CM | POA: Diagnosis not present

## 2018-04-07 NOTE — Patient Instructions (Addendum)
Medication Instructions:   If you need a refill on your cardiac medications before your next appointment, please call your pharmacy.   Lab work:  If you have labs (blood work) drawn today and your tests are completely normal, you will receive your results only by: Marland Kitchen. MyChart Message (if you have MyChart) OR . A paper copy in the mail If you have any lab test that is abnormal or we need to change your treatment, we will call you to review the results.  Testing/Procedures: Your physician has requested that you have an echocardiogram at [facility]. Echocardiography is a painless test that uses sound waves to create images of your heart. It provides your doctor with information about the size and shape of your heart and how well your heart's chambers and valves are working. This procedure takes approximately one hour. There are no restrictions for this procedure.  Your physician has requested that you have en exercise stress myoview at [facility]. For further information please visit [email or web address]. Please follow instruction sheet, as given.  Your physician has requested that you have a carotid duplex at [facility]. This test is an ultrasound of the carotid arteries in your neck. It looks at blood flow through these arteries that supply the brain with blood. Allow one hour for this exam. There are no restrictions or special instructions.  Follow-Up: At [facility], you and your health needs are our priority.  As part of our continuing mission to provide you with exceptional heart care, we have created designated Provider Care Teams.  These Care Teams include your primary Cardiologist (physician) and Advanced Practice Providers (APPs -  Physician Assistants and Nurse Practitioners) who all work together to provide you with the care you need, when you need it. Your physician recommends that you schedule a follow-up appointment as needed.

## 2018-04-16 ENCOUNTER — Encounter (HOSPITAL_COMMUNITY)
Admission: RE | Admit: 2018-04-16 | Discharge: 2018-04-16 | Disposition: A | Discharge status: Home / Self Care | Payer: BLUE CROSS/BLUE SHIELD | Source: Ambulatory Visit | Attending: Cardiovascular Disease | Admitting: Cardiovascular Disease

## 2018-04-16 ENCOUNTER — Ambulatory Visit (HOSPITAL_BASED_OUTPATIENT_CLINIC_OR_DEPARTMENT_OTHER)
Admission: RE | Admit: 2018-04-16 | Discharge: 2018-04-16 | Disposition: A | Discharge status: Home / Self Care | Payer: BLUE CROSS/BLUE SHIELD | Source: Ambulatory Visit | Attending: Cardiovascular Disease | Admitting: Cardiovascular Disease

## 2018-04-16 ENCOUNTER — Encounter (HOSPITAL_COMMUNITY): Payer: Self-pay

## 2018-04-16 ENCOUNTER — Ambulatory Visit (HOSPITAL_COMMUNITY)
Admission: RE | Admit: 2018-04-16 | Discharge: 2018-04-16 | Disposition: A | Discharge status: Home / Self Care | Payer: BLUE CROSS/BLUE SHIELD | Source: Ambulatory Visit | Attending: Cardiovascular Disease | Admitting: Cardiovascular Disease

## 2018-04-16 ENCOUNTER — Encounter (HOSPITAL_BASED_OUTPATIENT_CLINIC_OR_DEPARTMENT_OTHER)
Admission: RE | Admit: 2018-04-16 | Discharge: 2018-04-16 | Disposition: A | Discharge status: Home / Self Care | Payer: BLUE CROSS/BLUE SHIELD | Source: Ambulatory Visit | Attending: Cardiovascular Disease | Admitting: Cardiovascular Disease

## 2018-04-16 DIAGNOSIS — R0989 Other specified symptoms and signs involving the circulatory and respiratory systems: Secondary | ICD-10-CM | POA: Diagnosis not present

## 2018-04-16 DIAGNOSIS — I6523 Occlusion and stenosis of bilateral carotid arteries: Secondary | ICD-10-CM | POA: Insufficient documentation

## 2018-04-16 DIAGNOSIS — R079 Chest pain, unspecified: Secondary | ICD-10-CM | POA: Insufficient documentation

## 2018-04-16 DIAGNOSIS — R011 Cardiac murmur, unspecified: Secondary | ICD-10-CM

## 2018-04-16 LAB — NM MYOCAR MULTI W/SPECT W/WALL MOTION / EF
CHL CUP NUCLEAR SSS: 2
CSEPED: 7 min
CSEPPHR: 155 {beats}/min
Estimated workload: 8.8 METS
Exercise duration (sec): 31 s
LHR: 0.44
LV sys vol: 28 mL
LVDIAVOL: 91 mL (ref 46–106)
MPHR: 164 {beats}/min
NUC STRESS TID: 1
Percent HR: 94 %
RPE: 13
Rest HR: 65 {beats}/min
SDS: 2
SRS: 0

## 2018-04-16 MED ORDER — TECHNETIUM TC 99M TETROFOSMIN IV KIT
10.0000 | PACK | Freq: Once | INTRAVENOUS | Status: AC | PRN
  Administered 2018-04-16: 10.5 via INTRAVENOUS

## 2018-04-16 MED ORDER — SODIUM CHLORIDE 0.9% FLUSH
INTRAVENOUS | Status: AC
  Administered 2018-04-16: 10 mL via INTRAVENOUS
  Filled 2018-04-16: qty 10

## 2018-04-16 MED ORDER — TECHNETIUM TC 99M TETROFOSMIN IV KIT
30.0000 | PACK | Freq: Once | INTRAVENOUS | Status: AC | PRN
  Administered 2018-04-16: 30 via INTRAVENOUS

## 2018-04-16 MED ORDER — REGADENOSON 0.4 MG/5ML IV SOLN
INTRAVENOUS | Status: AC
  Filled 2018-04-16: qty 5

## 2018-04-16 NOTE — Progress Notes (Signed)
*  PRELIMINARY RESULTS* Echocardiogram 2D Echocardiogram has been performed.  Jeryl Columbialliott, Riyanna Crutchley 04/16/2018, 1:48 PM

## 2018-04-22 ENCOUNTER — Telehealth: Payer: Self-pay

## 2018-04-22 NOTE — Telephone Encounter (Signed)
-----   Message from Wendall StadePeter C Nishan, MD sent at 04/21/2018  8:25 AM EST ----- I think nuclear is low risk breast attenuation vetical images normal ECG normal with exercise no need for cath

## 2018-04-22 NOTE — Telephone Encounter (Signed)
Called pt. No answer, left message for pt to return call.  

## 2018-04-29 ENCOUNTER — Telehealth: Payer: Self-pay | Admitting: Cardiovascular Disease

## 2018-04-29 NOTE — Telephone Encounter (Signed)
Called patient's husband about patient's results of stress test, carotids and echo. Patient was very concerned about echo results. Discussed prevention of how to prevent calcium on aortic valve with diet and exercise. Tried to explain to patient's husband that overall echo was good. Patient's husband requested if Dr. Eden EmmsNishan could please call him and discuss the echo results with him. Will forward to Dr. Eden EmmsNishan.    RESULTS of Patient's test Notes recorded by Wendall StadeNishan, Peter C, MD on 04/21/2018 at 8:25 AM EST I think nuclear is low risk breast attenuation vetical images normal ECG normal with exercise no need for cath  Notes recorded by Wendall StadeNishan, Peter C, MD on 04/21/2018 at 8:33 AM EST Carotids ok  Notes recorded by Wendall StadeNishan, Peter C, MD on 04/21/2018 at 8:10 AM EST Just a little calcium on aortic valve overall good

## 2018-04-29 NOTE — Telephone Encounter (Signed)
New message    Patient husband is call to follow up on test results that they were given

## 2019-05-27 IMAGING — US BILATERAL CAROTID DUPLEX ULTRASOUND
1 series · 13 of 24 positions shown · non-contrast
Comparison: None.

CLINICAL DATA: 57-year-old female with a history of bilateral bruit

EXAM:
BILATERAL CAROTID DUPLEX ULTRASOUND
TECHNIQUE: Gray scale imaging, color Doppler and duplex ultrasound were
performed of bilateral carotid and vertebral arteries in the neck.

[Series 1: bilateral carotid duplex ultrasound · 0.06mm/px · 13 of 67 slices shown]
[im 1/67]
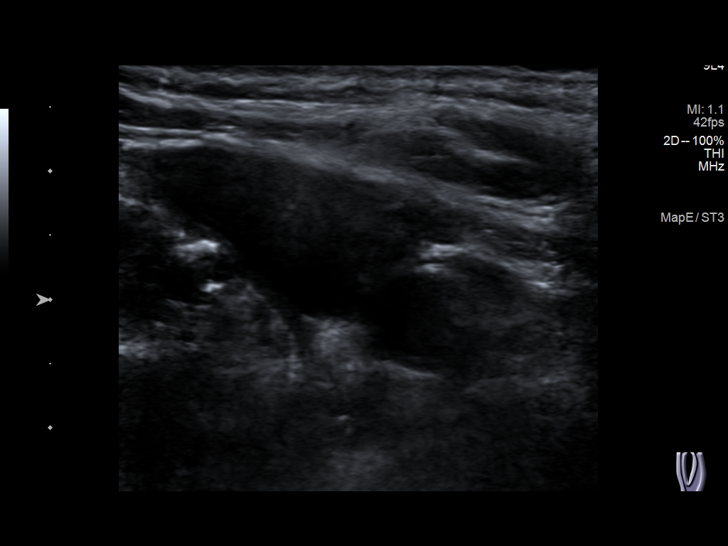
[im 6/67]
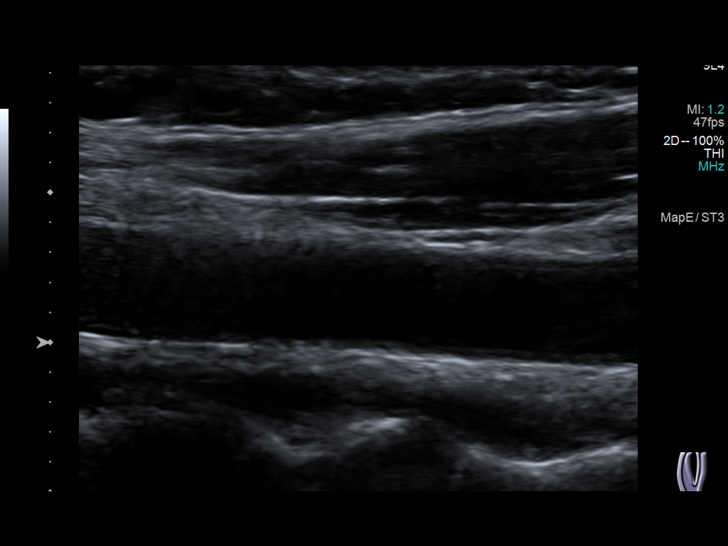
[im 12/67]
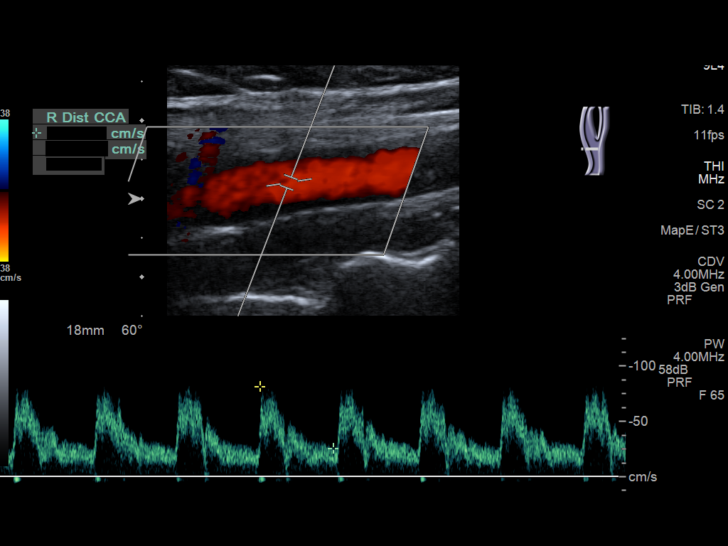
[im 18/67]
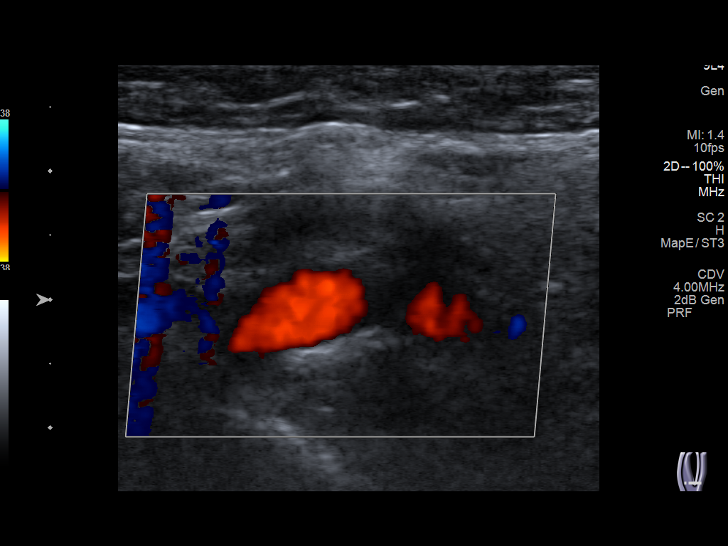
[im 23/67]
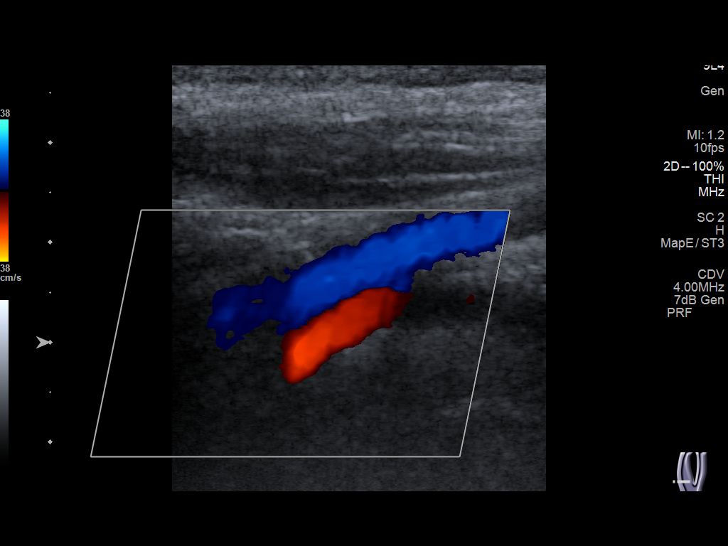
[im 29/67]
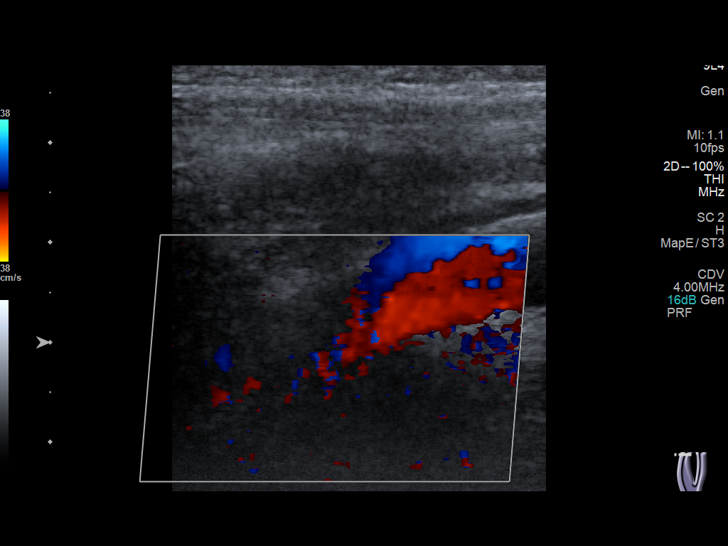
[im 35/67]
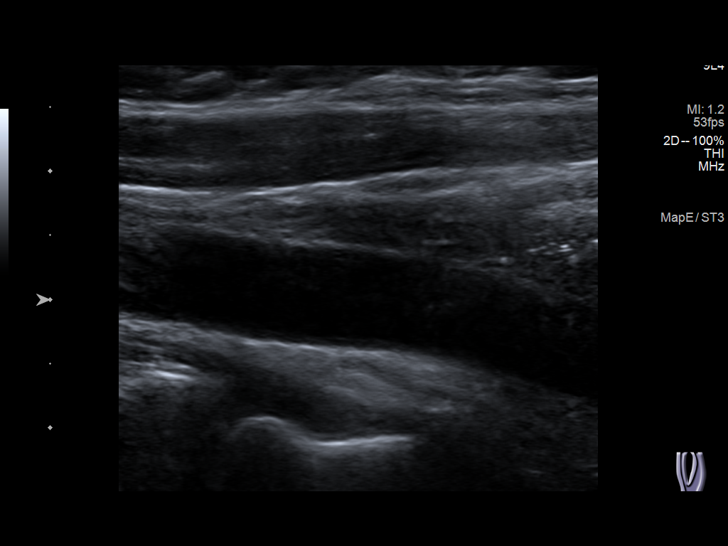
[im 38/67]
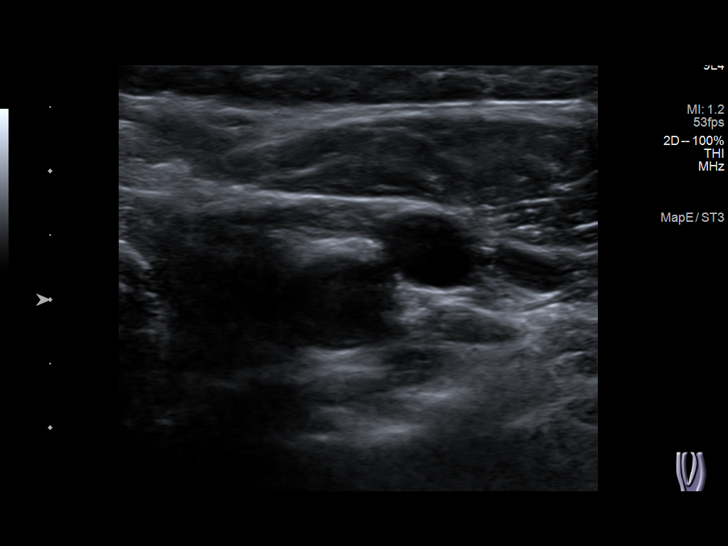
[im 44/67]
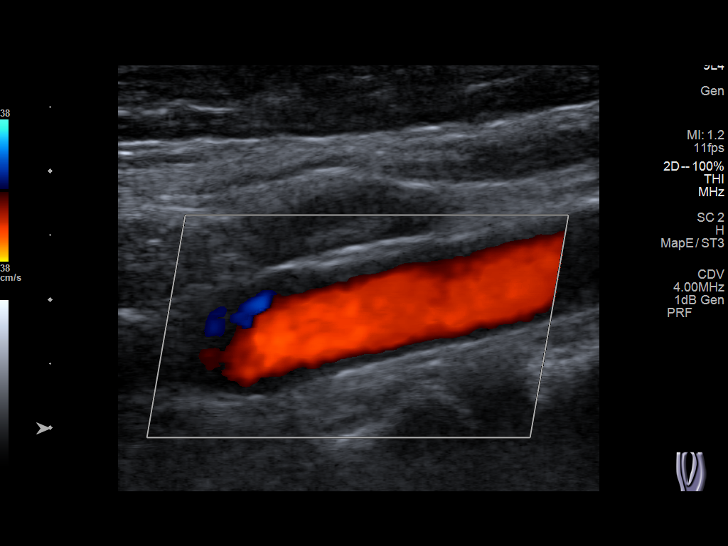
[im 49/67]
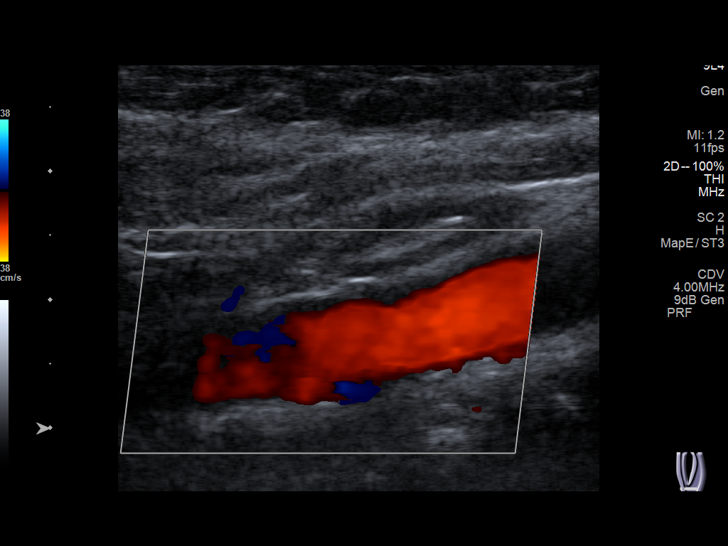
[im 55/67]
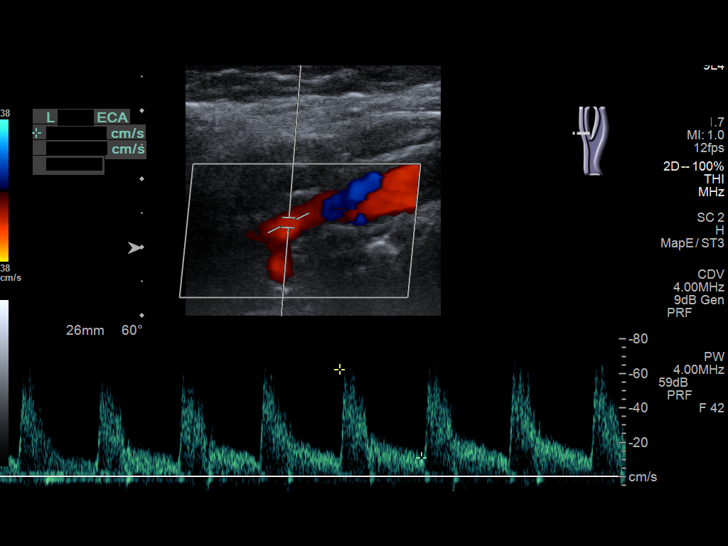
[im 61/67]
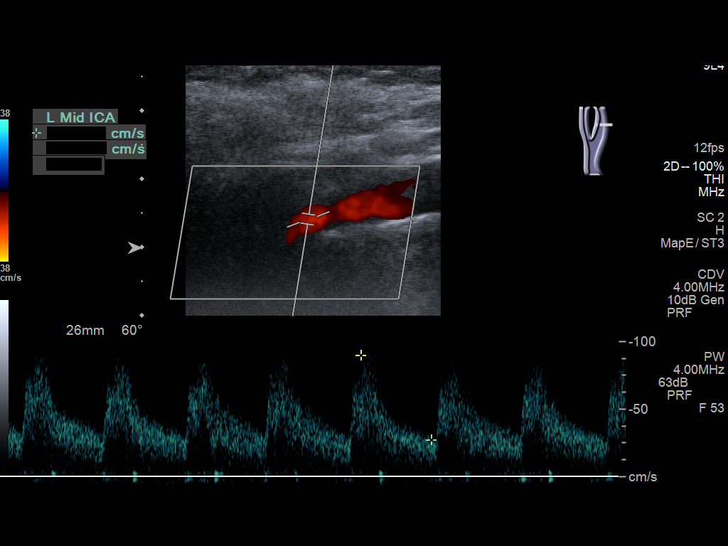
[im 67/67]
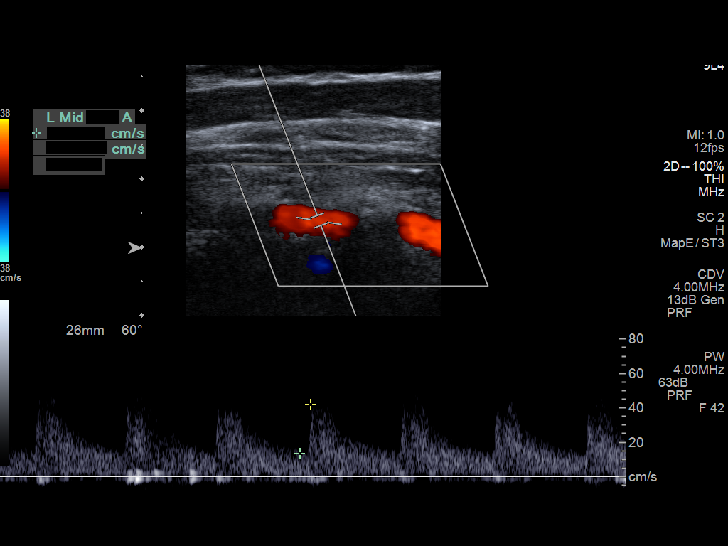

[13 of 24 positions shown; findings below may reference images not displayed]

FINDINGS: Criteria: Quantification of carotid stenosis is based on velocity
parameters that correlate the residual internal carotid diameter
with NASCET-based stenosis levels, using the diameter of the distal
internal carotid lumen as the denominator for stenosis measurement.

The following velocity measurements were obtained:

RIGHT

ICA:  Systolic 93 cm/sec, Diastolic 25 cm/sec

CCA:  95 cm/sec

SYSTOLIC ICA/CCA RATIO:

ECA:  96 cm/sec

LEFT

ICA:  Systolic 102 cm/sec, Diastolic 37 cm/sec

CCA:  89 cm/sec

SYSTOLIC ICA/CCA RATIO:

ECA:  62 cm/sec

Right Brachial SBP: Not acquired

Left Brachial SBP: Not acquired

RIGHT CAROTID ARTERY: No significant calcified disease of the right
common carotid artery. Intermediate waveform maintained.
Heterogeneous plaque without significant calcifications at the right
carotid bifurcation. Low resistance waveform of the right ICA. No
significant tortuosity.

RIGHT VERTEBRAL ARTERY: Antegrade flow with low resistance waveform.

LEFT CAROTID ARTERY: No significant calcified disease of the left
common carotid artery. Intermediate waveform maintained.
Heterogeneous plaque at the left carotid bifurcation without
significant calcifications. Low resistance waveform of the left ICA.

LEFT VERTEBRAL ARTERY:  Antegrade flow with low resistance waveform.
IMPRESSION: Color duplex indicates minimal heterogeneous plaque, with no
hemodynamically significant stenosis by duplex criteria in the
extracranial cerebrovascular circulation.

## 2020-11-16 ENCOUNTER — Other Ambulatory Visit (HOSPITAL_COMMUNITY): Payer: Self-pay | Admitting: Family Medicine

## 2020-11-16 DIAGNOSIS — Z1231 Encounter for screening mammogram for malignant neoplasm of breast: Secondary | ICD-10-CM

## 2022-01-01 ENCOUNTER — Encounter: Payer: Self-pay | Admitting: Emergency Medicine

## 2022-01-01 ENCOUNTER — Ambulatory Visit
Admission: EM | Admit: 2022-01-01 | Discharge: 2022-01-01 | Disposition: A | Discharge status: Home / Self Care | Payer: BC Managed Care – PPO | Attending: Nurse Practitioner | Admitting: Nurse Practitioner

## 2022-01-01 DIAGNOSIS — L255 Unspecified contact dermatitis due to plants, except food: Secondary | ICD-10-CM | POA: Diagnosis not present

## 2022-01-01 MED ORDER — TRIAMCINOLONE ACETONIDE 0.1 % EX OINT: 1.0000 | TOPICAL_OINTMENT | Freq: Two times a day (BID) | CUTANEOUS | 0 refills | Status: DC

## 2022-01-01 MED ORDER — PREDNISONE 10 MG PO TABS: ORAL_TABLET | ORAL | 0 refills | Status: DC

## 2022-01-01 MED ORDER — METHYLPREDNISOLONE SODIUM SUCC 125 MG IJ SOLR
60.0000 mg | Freq: Once | INTRAMUSCULAR | Status: AC
  Administered 2022-01-01: 60 mg via INTRAMUSCULAR

## 2022-01-01 NOTE — ED Provider Notes (Signed)
[facility]    CSN: [id number] Arrival date & time: 01/01/22  1531      History   Chief Complaint Chief Complaint  Patient presents with   Poison Ivy    HPI Tammy Carter is a 61 y.o. female.   Patient presents with daughter who provides history.  Patient declines medical interpreter today.  Daughter reports rash that has been present since last Thursday.  Patient was doing outdoor yard work before the rash started last week.  Reports the rash is itchy, red, blistered up, and oozing in certain areas.  She reports the rash is on bilateral lower extremities, bilateral upper extremities, and trunk moving up the neck.  She denies any fevers, or nausea/vomiting.  Denies any recent change in detergents, soaps, personal care products.  She has had a similar rash in the past that began after being in contact with poison ivy.  Reports the rash is spreading.  Has not taken anything over-the-counter because it does not typically help her.  She denies shortness of breath, throat or tongue swelling, new muscle pain or joint aches today.  Medical history significant for hypertension and heart murmur.     Past Medical History:  Diagnosis Date   Abnormal levels of other serum enzymes    ADD (attention deficit disorder)    Allergic contact dermatitis due to plants, except food    Body mass index 37.0-37.9, adult    Body mass index 38.0-38.9, adult    CAD (coronary artery disease)    Depression, major    Dyspepsia 02/23/2013   Dysphagia, unspecified(787.20) 02/23/2013   Dysuria    Encounter for general adult medical examination with abnormal findings    Essential (primary) hypertension    GERD (gastroesophageal reflux disease)    HSV infection    Hx of migraines    Hyperlipidemia    Hypertension    Hypothyroidism    Localized edema    Other fatigue    Other malaise    Other seasonal allergic rhinitis    Other specified noninflammatory disorders of vagina    Pain in  throat    Palpitations    SVT (supraventricular tachycardia) (HCC)    Unspecified constipation 02/23/2013   Unspecified urinary incontinence     Patient Active Problem List   Diagnosis Date Noted   Dysphagia, unspecified(787.20) 02/23/2013   Dyspepsia 02/23/2013   Unspecified constipation 02/23/2013    Past Surgical History:  Procedure Laterality Date   APPENDECTOMY     COLONOSCOPY   09/26/2005   [id number] colonoscopy   ESOPHAGOGASTRODUODENOSCOPY  09/26/2005   NUR: Small gastric polyps at body otherwise normal esophagogastroduodenoscopy. Three of these polyps were biopsied for  histology. Endoscopically these appeared to be hyperplastic/ Esophagus dilated by passing 56 French Maloney dilator, empiric. benign path   PALATE SURGERY     EXPANSION    OB History   No obstetric history on file.      Home Medications    Prior to Admission medications   Medication Sig Start Date End Date Taking? Authorizing Provider  predniSONE (DELTASONE) 10 MG tablet Take 6 tablets by mouth daily for 2 days, then reduce by 1 tablet every 2 days until gone 01/01/22  Yes Cathlean Marseilles A, NP  triamcinolone ointment (KENALOG) 0.1 % Apply 1 Application topically 2 (two) times daily. Apply sparingly to affected areas; do not use for more than 2 weeks 01/01/22  Yes Cathlean Marseilles A, NP  amLODipine (NORVASC) 2.5 MG tablet Take 2.5  mg by mouth daily.    [provider]  Calcium-Magnesium-Vitamin D (CALCIUM 500 PO) Take 1 tablet by mouth daily.    [provider]  ferrous sulfate 325 (65 FE) MG tablet Take 325 mg by mouth daily with breakfast.    [provider]  lisinopril (PRINIVIL,ZESTRIL) 5 MG tablet Take 5 mg by mouth daily.    [provider]  Multiple Vitamins-Minerals (VITAMIN D3 COMPLETE PO) Take 1 tablet by mouth daily.    [provider]  Multiple Vitamins-Minerals (WOMENS MULTIVITAMIN PO) Take by mouth daily.    [provider]     Family History Family History  Problem Relation Age of Onset   Heart disease Mother    Hypertension Mother    Diabetes type II Mother    Heart attack Mother 48   CAD Mother    Aneurysm Father        CEREBRAL   Hypertension Father    AAA (abdominal aortic aneurysm) Father    Valvular heart disease Father    CAD Father    Hypertension Sister    High Cholesterol Sister    Heart Problems Sister        PVC   Muscular dystrophy Brother    Heart disease Maternal Grandmother    Cancer Paternal Grandmother 23       BREAST AND COLON   Healthy Daughter    Healthy Daughter    Colon cancer Neg Hx     Social History Social History   Tobacco Use   Smoking status: Former    Types: Cigarettes    Quit date: 04/05/1979    Years since quitting: 42.7   Smokeless tobacco: Never   Tobacco comments:    SOCIAL  Substance Use Topics   Alcohol use: Yes    Alcohol/week: 1.0 - 2.0 standard drink of alcohol    Types: 1 - 2 Glasses of wine per week   Drug use: No     Allergies   Atorvastatin   Review of Systems Review of Systems Per HPI  Physical Exam Triage Vital Signs ED Triage Vitals  Enc Vitals Group     BP 01/01/22 1627 121/75     Pulse Rate 01/01/22 1627 (!) 59     Resp 01/01/22 1627 16     Temp 01/01/22 1627 98.2 F (36.8 C)     Temp Source 01/01/22 1627 Oral     SpO2 01/01/22 1627 97 %     Weight --      Height --      Head Circumference --      Peak Flow --      Pain Score 01/01/22 1626 4     Pain Loc --      Pain Edu? --      Excl. in GC? --    No data found.  Updated Vital Signs BP 121/75 (BP Location: Right Arm)   Pulse (!) 59   Temp 98.2 F (36.8 C) (Oral)   Resp 16   SpO2 97%   Visual Acuity Right Eye Distance:   Left Eye Distance:   Bilateral Distance:    Right Eye Near:   Left Eye Near:    Bilateral Near:     Physical Exam Vitals and nursing note reviewed.  Constitutional:      General: She is not in acute distress.     Appearance: Normal appearance. She is not toxic-appearing.  HENT:     Mouth/Throat:  Mouth: Mucous membranes are moist.     Pharynx: Oropharynx is clear.  Pulmonary:     Effort: Pulmonary effort is normal. No respiratory distress.  Skin:    General: Skin is warm and dry.     Capillary Refill: Capillary refill takes less than 2 seconds.     Findings: Rash present. Rash is vesicular.     Comments: Vesicles noted to bilateral lower extremities that are oozing serous drainage.  There is a maculopapular rash on her bilateral upper extremities, neck.  No surrounding erythema, fluctuance, or warmth.  Neurological:     Mental Status: She is alert and oriented to person, place, and time.  Psychiatric:        Behavior: Behavior is cooperative.      UC Treatments / Results  Labs (all labs ordered are listed, but only abnormal results are displayed) Labs Reviewed - No data to display  EKG   Radiology No results found.  Procedures Procedures (including critical care time)  Medications Ordered in UC Medications  methylPREDNISolone sodium succinate (SOLU-MEDROL) 125 mg/2 mL injection 60 mg (60 mg Intramuscular Given 01/01/22 1700)    Initial Impression / Assessment and Plan / UC Course  I have reviewed the triage vital signs and the nursing notes.  Pertinent labs & imaging results that were available during my care of the patient were reviewed by me and considered in my medical decision making (see chart for details).    Patient is a very pleasant, well-appearing 61 year old female presenting for rash today.  Suspect rhus dermatitis.  Treat with Solu-Medrol 60 mg IM today in urgent care.  Start prednisone taper tomorrow.  Can use triamcinolone ointment twice daily to itchy/affected areas for the next couple of weeks.  Seek care if symptoms persist or worsen despite treatment.  The patient was given the opportunity to ask questions.  All questions answered to their satisfaction.  The  patient is in agreement to this plan.   Final Clinical Impressions(s) / UC Diagnoses   Final diagnoses:  Rhus dermatitis     Discharge Instructions      - We have given you a steroid shot to help with the inflammation and itch - Please start the oral steroid pills tomorrow morning - You can use the ointment for itching twice daily; do not use for more than 2 weeks in a row     ED Prescriptions     Medication Sig Dispense Auth. Provider   predniSONE (DELTASONE) 10 MG tablet Take 6 tablets by mouth daily for 2 days, then reduce by 1 tablet every 2 days until gone 42 tablet Cathlean Marseilles A, NP   triamcinolone ointment (KENALOG) 0.1 % Apply 1 Application topically 2 (two) times daily. Apply sparingly to affected areas; do not use for more than 2 weeks 30 g Valentino Nose, NP      PDMP not reviewed this encounter.   Valentino Nose, NP 01/01/22 1710

## 2022-01-01 NOTE — Discharge Instructions (Signed)
-   We have given you a steroid shot to help with the inflammation and itch - Please start the oral steroid pills tomorrow morning - You can use the ointment for itching twice daily; do not use for more than 2 weeks in a row

## 2022-01-01 NOTE — ED Triage Notes (Signed)
Pt presents with poison ivy that Started Thursday and got worse on Saturday.

## 2022-10-25 DIAGNOSIS — E559 Vitamin D deficiency, unspecified: Secondary | ICD-10-CM | POA: Diagnosis not present

## 2022-10-25 DIAGNOSIS — I10 Essential (primary) hypertension: Secondary | ICD-10-CM | POA: Diagnosis not present

## 2022-11-01 ENCOUNTER — Other Ambulatory Visit (HOSPITAL_COMMUNITY): Payer: Self-pay | Admitting: Family Medicine

## 2022-11-01 DIAGNOSIS — I83893 Varicose veins of bilateral lower extremities with other complications: Secondary | ICD-10-CM | POA: Diagnosis not present

## 2022-11-01 DIAGNOSIS — Z0001 Encounter for general adult medical examination with abnormal findings: Secondary | ICD-10-CM | POA: Diagnosis not present

## 2022-11-01 DIAGNOSIS — Z1231 Encounter for screening mammogram for malignant neoplasm of breast: Secondary | ICD-10-CM

## 2022-11-01 DIAGNOSIS — Z0000 Encounter for general adult medical examination without abnormal findings: Secondary | ICD-10-CM | POA: Diagnosis not present

## 2022-11-01 DIAGNOSIS — E785 Hyperlipidemia, unspecified: Secondary | ICD-10-CM | POA: Diagnosis not present

## 2022-11-01 DIAGNOSIS — E663 Overweight: Secondary | ICD-10-CM | POA: Diagnosis not present

## 2022-11-01 DIAGNOSIS — E559 Vitamin D deficiency, unspecified: Secondary | ICD-10-CM | POA: Diagnosis not present

## 2022-11-01 DIAGNOSIS — R011 Cardiac murmur, unspecified: Secondary | ICD-10-CM | POA: Diagnosis not present

## 2022-11-01 DIAGNOSIS — J302 Other seasonal allergic rhinitis: Secondary | ICD-10-CM | POA: Diagnosis not present

## 2022-11-01 DIAGNOSIS — Z79899 Other long term (current) drug therapy: Secondary | ICD-10-CM | POA: Diagnosis not present

## 2022-11-01 DIAGNOSIS — I10 Essential (primary) hypertension: Secondary | ICD-10-CM | POA: Diagnosis not present

## 2022-11-08 ENCOUNTER — Encounter (HOSPITAL_COMMUNITY): Payer: Self-pay

## 2022-11-08 ENCOUNTER — Ambulatory Visit (HOSPITAL_COMMUNITY)
Admission: RE | Admit: 2022-11-08 | Discharge: 2022-11-08 | Disposition: A | Discharge status: Home / Self Care | Payer: Medicaid Other | Source: Ambulatory Visit | Attending: Family Medicine | Admitting: Family Medicine

## 2022-11-08 DIAGNOSIS — Z1231 Encounter for screening mammogram for malignant neoplasm of breast: Secondary | ICD-10-CM | POA: Diagnosis not present

## 2022-12-05 DIAGNOSIS — Z1211 Encounter for screening for malignant neoplasm of colon: Secondary | ICD-10-CM | POA: Diagnosis not present

## 2022-12-15 DIAGNOSIS — D3100 Benign neoplasm of unspecified conjunctiva: Secondary | ICD-10-CM | POA: Diagnosis not present

## 2023-02-08 ENCOUNTER — Telehealth: Payer: Self-pay

## 2023-02-08 NOTE — Telephone Encounter (Signed)
Medicaid Managed Care   Unsuccessful Outreach Note  02/08/2023 Name: Tammy Carter MRN: [id number] DOB: [birth year]  Referred by: Benita Stabile, MD Reason for referral : No chief complaint on file.   An unsuccessful telephone outreach was attempted today. The patient was referred to the case management team for assistance with care management and care coordination.   Follow Up Plan: If patient returns call to provider office, please advise to call Embedded Care Management Care Guide Nicholes Rough* at [phone]*  Nicholes Rough, CMA Care Guide [employer]

## 2023-11-23 ENCOUNTER — Observation Stay (HOSPITAL_COMMUNITY)

## 2023-11-23 ENCOUNTER — Observation Stay (HOSPITAL_BASED_OUTPATIENT_CLINIC_OR_DEPARTMENT_OTHER)

## 2023-11-23 ENCOUNTER — Observation Stay (HOSPITAL_COMMUNITY)
Admission: EM | Admit: 2023-11-23 | Discharge: 2023-11-26 | Disposition: A | Discharge status: Home / Self Care | Attending: Internal Medicine | Admitting: Internal Medicine

## 2023-11-23 ENCOUNTER — Other Ambulatory Visit: Payer: Self-pay

## 2023-11-23 ENCOUNTER — Encounter (HOSPITAL_COMMUNITY): Payer: Self-pay | Admitting: Internal Medicine

## 2023-11-23 ENCOUNTER — Emergency Department (HOSPITAL_COMMUNITY)

## 2023-11-23 DIAGNOSIS — R55 Syncope and collapse: Principal | ICD-10-CM

## 2023-11-23 DIAGNOSIS — Z7901 Long term (current) use of anticoagulants: Secondary | ICD-10-CM | POA: Diagnosis not present

## 2023-11-23 DIAGNOSIS — Z79899 Other long term (current) drug therapy: Secondary | ICD-10-CM | POA: Insufficient documentation

## 2023-11-23 DIAGNOSIS — K921 Melena: Secondary | ICD-10-CM | POA: Diagnosis not present

## 2023-11-23 DIAGNOSIS — E861 Hypovolemia: Secondary | ICD-10-CM | POA: Insufficient documentation

## 2023-11-23 DIAGNOSIS — I10 Essential (primary) hypertension: Secondary | ICD-10-CM | POA: Diagnosis not present

## 2023-11-23 DIAGNOSIS — I700 Atherosclerosis of aorta: Secondary | ICD-10-CM | POA: Insufficient documentation

## 2023-11-23 DIAGNOSIS — W19XXXA Unspecified fall, initial encounter: Secondary | ICD-10-CM | POA: Insufficient documentation

## 2023-11-23 DIAGNOSIS — R109 Unspecified abdominal pain: Secondary | ICD-10-CM | POA: Diagnosis not present

## 2023-11-23 DIAGNOSIS — K529 Noninfective gastroenteritis and colitis, unspecified: Principal | ICD-10-CM

## 2023-11-23 DIAGNOSIS — R1012 Left upper quadrant pain: Secondary | ICD-10-CM | POA: Insufficient documentation

## 2023-11-23 DIAGNOSIS — S0990XA Unspecified injury of head, initial encounter: Secondary | ICD-10-CM | POA: Diagnosis not present

## 2023-11-23 DIAGNOSIS — R930 Abnormal findings on diagnostic imaging of skull and head, not elsewhere classified: Secondary | ICD-10-CM | POA: Diagnosis not present

## 2023-11-23 DIAGNOSIS — R001 Bradycardia, unspecified: Secondary | ICD-10-CM | POA: Diagnosis not present

## 2023-11-23 DIAGNOSIS — I6782 Cerebral ischemia: Secondary | ICD-10-CM | POA: Diagnosis not present

## 2023-11-23 DIAGNOSIS — E039 Hypothyroidism, unspecified: Secondary | ICD-10-CM | POA: Diagnosis not present

## 2023-11-23 DIAGNOSIS — K3189 Other diseases of stomach and duodenum: Secondary | ICD-10-CM | POA: Diagnosis not present

## 2023-11-23 LAB — URINALYSIS, ROUTINE W REFLEX MICROSCOPIC
Bacteria, UA: NONE SEEN
Bilirubin Urine: NEGATIVE
Glucose, UA: NEGATIVE mg/dL
Hgb urine dipstick: NEGATIVE
Ketones, ur: 5 mg/dL — AB
Leukocytes,Ua: NEGATIVE
Nitrite: NEGATIVE
Protein, ur: 30 mg/dL — AB
Specific Gravity, Urine: 1.023 (ref 1.005–1.030)
pH: 5 (ref 5.0–8.0)

## 2023-11-23 LAB — TROPONIN I (HIGH SENSITIVITY)
Troponin I (High Sensitivity): 3 ng/L (ref <18)
Troponin I (High Sensitivity): 2 ng/L (ref <18)

## 2023-11-23 LAB — CBC
HCT: 42.8 % (ref 36.0–46.0)
HCT: 42.9 % (ref 36.0–46.0)
Hemoglobin: 14.2 g/dL (ref 12.0–15.0)
Hemoglobin: 14.4 g/dL (ref 12.0–15.0)
MCH: 30.1 pg (ref 26.0–34.0)
MCH: 30.1 pg (ref 26.0–34.0)
MCHC: 33.2 g/dL (ref 30.0–36.0)
MCHC: 33.6 g/dL (ref 30.0–36.0)
MCV: 90.9 fL (ref 80.0–100.0)
MCV: 89.7 fL (ref 80.0–100.0)
Platelets: 195 K/uL (ref 150–400)
Platelets: 194 K/uL (ref 150–400)
RBC: 4.71 MIL/uL (ref 3.87–5.11)
RBC: 4.78 MIL/uL (ref 3.87–5.11)
RDW: 12.3 % (ref 11.5–15.5)
RDW: 12.3 % (ref 11.5–15.5)
WBC: 4.9 K/uL (ref 4.0–10.5)
WBC: 8.4 K/uL (ref 4.0–10.5)
nRBC: 0 % (ref 0.0–0.2)
nRBC: 0 % (ref 0.0–0.2)

## 2023-11-23 LAB — LIPID PANEL
Cholesterol: 192 mg/dL (ref 0–200)
HDL: 61 mg/dL (ref >40)
LDL Cholesterol: 123 mg/dL — ABNORMAL HIGH (ref 0–99)
Total CHOL/HDL Ratio: 3.1 ratio
Triglycerides: 39 mg/dL (ref <150)
VLDL: 8 mg/dL (ref 0–40)

## 2023-11-23 LAB — COMPREHENSIVE METABOLIC PANEL WITH GFR
ALT: 17 U/L (ref 0–44)
AST: 24 U/L (ref 15–41)
Albumin: 4.1 g/dL (ref 3.5–5.0)
Alkaline Phosphatase: 66 U/L (ref 38–126)
Anion gap: 6 (ref 5–15)
BUN: 12 mg/dL (ref 8–23)
CO2: 26 mmol/L (ref 22–32)
Calcium: 9.3 mg/dL (ref 8.9–10.3)
Chloride: 102 mmol/L (ref 98–111)
Creatinine, Ser: 0.88 mg/dL (ref 0.44–1.00)
GFR, Estimated: >60 mL/min (ref >60)
Glucose, Bld: 165 mg/dL — ABNORMAL HIGH (ref 70–99)
Potassium: 3.9 mmol/L (ref 3.5–5.1)
Sodium: 134 mmol/L — ABNORMAL LOW (ref 135–145)
Total Bilirubin: 0.5 mg/dL (ref 0.0–1.2)
Total Protein: 7.4 g/dL (ref 6.5–8.1)

## 2023-11-23 LAB — TSH: TSH: 3.035 u[IU]/mL (ref 0.350–4.500)

## 2023-11-23 LAB — HEMOGLOBIN A1C
Hgb A1c MFr Bld: 4.9 % (ref 4.8–5.6)
Mean Plasma Glucose: 93.93 mg/dL

## 2023-11-23 LAB — ECHOCARDIOGRAM COMPLETE
AR max vel: 2.15 cm2
AV Area VTI: 2.52 cm2
AV Area mean vel: 1.92 cm2
AV Mean grad: 9 mmHg
AV Peak grad: 17.6 mmHg
Ao pk vel: 2.1 m/s
Area-P 1/2: 3.12 cm2
Height: 65 in
S' Lateral: 2.5 cm
Weight: 2800 [oz_av]

## 2023-11-23 LAB — LACTIC ACID, PLASMA: Lactic Acid, Venous: 1.8 mmol/L (ref 0.5–1.9)

## 2023-11-23 LAB — HIV ANTIBODY (ROUTINE TESTING W REFLEX): HIV Screen 4th Generation wRfx: REACTIVE — AB

## 2023-11-23 LAB — POC OCCULT BLOOD, ED: Fecal Occult Bld: POSITIVE — AB

## 2023-11-23 LAB — LIPASE, BLOOD: Lipase: 27 U/L (ref 11–51)

## 2023-11-23 MED ORDER — ACETAMINOPHEN 650 MG RE SUPP
650.0000 mg | Freq: Four times a day (QID) | RECTAL | Status: DC | PRN
Start: 2023-11-23 — End: 2023-11-26

## 2023-11-23 MED ORDER — PANTOPRAZOLE SODIUM 40 MG IV SOLR
40.0000 mg | Freq: Once | INTRAVENOUS | Status: AC
  Administered 2023-11-23: 40 mg via INTRAVENOUS
  Filled 2023-11-23: qty 10

## 2023-11-23 MED ORDER — GADOBUTROL 1 MMOL/ML IV SOLN
7.5000 mL | Freq: Once | INTRAVENOUS | Status: AC | PRN
  Administered 2023-11-23: 7.5 mL via INTRAVENOUS

## 2023-11-23 MED ORDER — ACETAMINOPHEN 325 MG PO TABS
650.0000 mg | ORAL_TABLET | Freq: Four times a day (QID) | ORAL | Status: DC | PRN
  Administered 2023-11-25: 650 mg via ORAL
  Filled 2023-11-23 (×2): qty 2

## 2023-11-23 MED ORDER — IOHEXOL 350 MG/ML SOLN
75.0000 mL | Freq: Once | INTRAVENOUS | Status: AC | PRN
  Administered 2023-11-23: 75 mL via INTRAVENOUS

## 2023-11-23 MED ORDER — METRONIDAZOLE 500 MG/100ML IV SOLN
500.0000 mg | Freq: Once | INTRAVENOUS | Status: AC
  Administered 2023-11-23: 500 mg via INTRAVENOUS
  Filled 2023-11-23: qty 100

## 2023-11-23 MED ORDER — CIPROFLOXACIN IN D5W 400 MG/200ML IV SOLN
400.0000 mg | Freq: Once | INTRAVENOUS | Status: AC
  Administered 2023-11-23: 400 mg via INTRAVENOUS
  Filled 2023-11-23: qty 200

## 2023-11-23 MED ORDER — ENOXAPARIN SODIUM 40 MG/0.4ML IJ SOSY
40.0000 mg | PREFILLED_SYRINGE | INTRAMUSCULAR | Status: DC
  Administered 2023-11-23: 40 mg via SUBCUTANEOUS
  Filled 2023-11-23: qty 0.4

## 2023-11-23 MED ORDER — LACTATED RINGERS IV BOLUS
1000.0000 mL | Freq: Once | INTRAVENOUS | Status: AC
  Administered 2023-11-23: 1000 mL via INTRAVENOUS

## 2023-11-23 NOTE — ED Notes (Signed)
 Pt ambulated with steady gait without incident to restroom

## 2023-11-23 NOTE — ED Triage Notes (Addendum)
 Patient reports pain across lower abdomen with nausea this evening , brief syncope at home , unable to recall incident , alert and oriented at arrival , respirations unlabored . No fever or chills . Hypertensive at arrival.

## 2023-11-23 NOTE — ED Provider Notes (Signed)
 [facility] Emergency Department Provider Note MRN:  [id number]  Arrival date & time: 11/23/23     Chief Complaint   Abdominal Pain and Syncope   History of Present Illness   Tammy Carter is a 63 y.o. year-old female presents to the ED with chief complaint of abdominal pain for the past day.  Had a syncopal event tonight around 1 or 2 am.  The fall was unwitnessed, but was very loud per the daughter. Was not post ictal.  No hx of seizures.  Reports that she has had some diarrhea mixed with dark/black stool.  She had taken some imodium earlier.  She continues to complaint of mild to moderate diffuse abdominal pain that comes and goes in waves.  She denies fevers, chills, nausea, or vomiting.  She denies any dysuria.  She is not anticoagulated.   Family member assists with interpretation. Professional interpreter offered, but declined.  Review of Systems  Pertinent positive and negative review of systems noted in HPI.    Physical Exam   Vitals:   11/23/23 0250 11/23/23 0605  BP: (!) 175/86 (!) 173/87  Pulse: (!) 51 70  Resp: 18 19  Temp: (!) 97.5 F (36.4 C) 97.7 F (36.5 C)  SpO2: 100% 100%    CONSTITUTIONAL:  non toxic-appearing, NAD NEURO:  Alert and oriented x 3, CN 3-12 grossly intact EYES:  eyes equal and reactive ENT/NECK:  Supple, no stridor  CARDIO:  bradycardic, regular rhythm, appears well-perfused  PULM:  No respiratory distress, CTAB GI/GU:  non-distended, no focal tenderness or guarding MSK/SPINE:  No gross deformities, no edema, moves all extremities  SKIN:  no rash, atraumatic   *Additional and/or pertinent findings included in MDM below  Diagnostic and Interventional Summary    EKG Interpretation Date/Time:  Saturday November 23 2023 06:02:28 EDT Ventricular Rate:  75 PR Interval:  189 QRS Duration:  82 QT Interval:  403 QTC Calculation: 451 R Axis:   71  Text Interpretation: Sinus rhythm No significant change was found  Confirmed by Carita Senior [phone]) on 11/23/2023 6:05:17 AM       Labs Reviewed  COMPREHENSIVE METABOLIC PANEL WITH GFR - Abnormal; Notable for the following components:      Result Value   Sodium 134 (*)    Glucose, Bld 165 (*)    All other components within normal limits  POC OCCULT BLOOD, ED - Abnormal; Notable for the following components:   Fecal Occult Bld POSITIVE (*)    All other components within normal limits  LIPASE, BLOOD  CBC  URINALYSIS, ROUTINE W REFLEX MICROSCOPIC  CBC  TROPONIN I (HIGH SENSITIVITY)    CT ABDOMEN PELVIS W CONTRAST    (Results Pending)  CT HEAD WO CONTRAST ( )    (Results Pending)    Medications  lactated ringers  bolus 1,000 mL (1,000 mLs Intravenous New Bag/Given 11/23/23 0601)  pantoprazole  (PROTONIX ) injection 40 mg (40 mg Intravenous Given 11/23/23 0601)     Procedures  /  Critical Care Procedures  ED Course and Medical Decision Making  I have reviewed the triage vital signs, the nursing notes, and pertinent available records from the EMR.  Social Determinants Affecting Complexity of Care: Patient has no clinically significant social determinants affecting this chief complaint..   ED Course: Clinical Course as of 11/23/23 0612  Sat Nov 23, 2023  0606 Comprehensive metabolic panel(!) No significant electrolyte abnormality [RB]  0606 CBC No leukocytosis, no significant anemia [RB]  0606 Lipase, blood Normal  lipase [RB]  0607 POC occult blood, ED Positive, will check CT.   [RB]  0608 CT ABDOMEN PELVIS W CONTRAST CT pending at signout.  Will check for diverticulitis versus other. Anticipate need for admission.   [RB]    Clinical Course User Index [RB] Vicky Charleston, PA-C    Medical Decision Making Patient here with abdominal pain for the past day or so.  Had syncopal event last night.  Has had some diarrhea and dark stools as well.  She is not anticoagulated.  Her Hemoccult is positive.  She is still complaining of some  abdominal pain.  Will check CT.  She is uncertain if she hit her head when she fell.  Her daughter states that the thud from her falling was very loud.  Will check head CT.  Patient will be signed out to oncoming team, anticipate admission for GI bleed.  Amount and/or Complexity of Data Reviewed Labs: ordered. Decision-making details documented in ED Course. Radiology: ordered. Decision-making details documented in ED Course. ECG/medicine tests: ordered.  Risk Prescription drug management.         Consultants:    Treatment and Plan: Patient signed out to oncoming team, who will continue care.  Plan: Anticipate admission for GI bleed and syncope. F/u on CTs and repeat CBC.    Final Clinical Impressions(s) / ED Diagnoses  No diagnosis found.  ED Discharge Orders     None         Discharge Instructions Discussed with and Provided to Patient:   Discharge Instructions   None      Vicky Charleston, PA-C 11/23/23 [id number]    Carita Senior, MD 11/23/23 [phone]

## 2023-11-23 NOTE — H&P (Signed)
 Date: 11/23/2023               Patient Name:  Tammy Carter MRN: [id number]  DOB: [birth year] Age / Sex: 63 y.o., female   PCP: Shona Norleen PEDLAR, MD         Medical Service: Internal Medicine Teaching Service         Attending Physician: Dr. MICAEL Riis Winfrey      First Contact: Sallyanne Primas, DO}    Second Contact: Dr. Hadassah Kristy Ahr, MD          Pager Information: First Contact Pager: [phone]   Second Contact Pager: [phone]   SUBJECTIVE   Chief Complaint: syncope and abdominal pain   History of Present Illness: Tammy Carter is a 63 y.o. female (spanish primary language) with PMH of HTN, hypothyroidism, and GERD.  The patient reports waxing and waning LUQ abdominal pain without diarrhea for 2 months. The pain was worse when eating greasy foods. The night of 7/4 the patient had increase abdominal pain without N, V, or Diarrhea. While she was in the bathroom, she took her blood pressure medication Lisinopril  5 mg and Amlodipine 2.5 mg. When she returned to the kitchen she was standing by the stove when she had a unwitnessed syncopal episode. The patient denied any pre-syncopal symptoms such dizziness/lightheadedness/tunnel vision, or previous orthostatic symptoms from sitting to standing. All she remembers is waking up next to her daughters on the floor. The daughters report that after they heard her fall, they ran down and it took 3-5 seconds to arouse her. It was unclear whether she hit her head. The patient denied any previous similar episodes or history of seizures.  While in the ED, the patient had diarrhea with blood in the stool.  Denies abdominal pain, N/V at time of exam Endorses previous constipation (every 3 days, little stool, unknown if hematochezia or melena)  ED Course:  The patient denied any symptoms of seizure to ED.   Labs: significant for no leukocytosis and H&H WNL (no comparisons)  Imaging:  Head CT No acute intracranial pathology  Patchy deep and  juxtacortical white matter hypodensity similar to prior examination. This appearance suggests demyelinating disorder such as Multiple sclerosis although could be related to hypertensive small-vessel white matter disease Previous CT head 2018: mild chronic ischemic white matter disease CT abdominal pelvis:  hyper enhancement of descending sigmoid and rectum colonic region consistent with either nonspecific infectious, inflammatory, or ischemic colitis Stomach distended by ingested material; possibly related to large meal, however correlate for gastroparesis or gastric outlet obstruction Aortic atherosclerosis  EKG Initial 2:54 am: sinus bradycardia  Second: sinus rhythm   Received: Ciprofloxacin  and Flagyl  (now d/ced) LR bolus  Pantoprazole  40 mg   Meds:  Patient reported: Lisinopril  5 mg and Amlodipine 2.5 mg  No outpatient medications have been marked as taking for the 11/23/23 encounter [facility] Encounter).    Past Medical History HTN  Past Surgical History  Colonoscopy in 2007 demonstrated polyps  Last cologuard screen was 2024, negative   Past Surgical History:  Procedure Laterality Date   APPENDECTOMY     COLONOSCOPY   09/26/2005   [facility]nm[facility]jo colonoscopy   ESOPHAGOGASTRODUODENOSCOPY  09/26/2005   NUR: Small gastric polyps at body otherwise normal esophagogastroduodenoscopy. Three of these polyps were biopsied for  histology. Endoscopically these appeared to be hyperplastic/ Esophagus dilated by passing 56 French Maloney dilator, empiric. benign path   PALATE SURGERY     EXPANSION    Social:  Lives With: husband (stroke 2021) Occupation: Support: Daughters  Level of Function: PCP: Shona Norleen PEDLAR, MD  Substances: -Tobacco: never -Alcohol: never -Recreational Drug: never  Family History:  Daughter reports her personal hx of needing anal fistulotomy  Family History  Problem Relation Age of Onset   Heart disease Mother    Hypertension Mother    Diabetes type  II Mother    Heart attack Mother 61   CAD Mother    Aneurysm Father        CEREBRAL   Hypertension Father    AAA (abdominal aortic aneurysm) Father    Valvular heart disease Father    CAD Father    Hypertension Sister    High Cholesterol Sister    Heart Problems Sister        PVC   Muscular dystrophy Brother    Heart disease Maternal Grandmother    Cancer Paternal Grandmother 60       BREAST AND COLON   Healthy Daughter    Healthy Daughter    Colon cancer Neg Hx      Allergies: Allergies as of 11/23/2023 - Reviewed 11/23/2023  Allergen Reaction Noted   Atorvastatin  04/04/2018    Review of Systems: A complete ROS was negative except as per HPI.   OBJECTIVE:   Physical Exam: Blood pressure 129/81, pulse 68, temperature 98.4 F (36.9 C), temperature source Oral, resp. rate 18, height 5' 5 (1.651 m), weight 79.4 kg, SpO2 100%.  Constitutional: well-appearing, laying in bed, in no acute distress HENT: mucous membranes moist Cardiovascular: systolic murmur right 2nd ICS, regular rate and rhythm Pulmonary/Chest: normal work of breathing on room air, lungs clear to auscultation bilaterally Abdominal: soft, non-tender, non-distended MSK: normal bulk and tone Skin: warm and dry  Labs: CBC    Component Value Date/Time   WBC 8.4 11/23/2023 0603   RBC 4.78 11/23/2023 0603   HGB 14.4 11/23/2023 0603   HCT 42.9 11/23/2023 0603   PLT 194 11/23/2023 0603   MCV 89.7 11/23/2023 0603   MCH 30.1 11/23/2023 0603   MCHC 33.6 11/23/2023 0603   RDW 12.3 11/23/2023 0603     CMP     Component Value Date/Time   NA 134 (L) 11/23/2023 0329   K 3.9 11/23/2023 0329   CL 102 11/23/2023 0329   CO2 26 11/23/2023 0329   GLUCOSE 165 (H) 11/23/2023 0329   BUN 12 11/23/2023 0329   CREATININE 0.88 11/23/2023 0329   CALCIUM  9.3 11/23/2023 0329   PROT 7.4 11/23/2023 0329   ALBUMIN 4.1 11/23/2023 0329   AST 24 11/23/2023 0329   ALT 17 11/23/2023 0329   ALKPHOS 66 11/23/2023 0329    BILITOT 0.5 11/23/2023 0329   GFRNONAA >60 11/23/2023 0329    Imaging:  CT HEAD WO CONTRAST ( ) Result Date: 11/23/2023 CLINICAL DATA:  Head trauma, syncope EXAM: CT HEAD WITHOUT CONTRAST TECHNIQUE: Contiguous axial images were obtained from the base of the skull through the vertex without intravenous contrast. RADIATION DOSE REDUCTION: This exam was performed according to the departmental dose-optimization program which includes automated exposure control, adjustment of the mA and/or kV according to patient size and/or use of iterative reconstruction technique. COMPARISON:  07/18/2016 FINDINGS: Brain: No evidence of acute infarction, hemorrhage, hydrocephalus, extra-axial collection or mass lesion/mass effect. Patchy deep and juxtacortical white matter hypodensity, similar to prior examination (series 4, image 18). Vascular: No hyperdense vessel or unexpected calcification. Skull: Normal. Negative for fracture or focal lesion. Sinuses/Orbits: No acute finding. Other:  None. IMPRESSION: 1. No acute intracranial pathology. 2. Patchy deep and juxtacortical white matter hypodensity, similar to prior examination. This appearance suggests demyelinating disorder such as multiple sclerosis although could be related to hypertensive small-vessel white matter disease. Electronically Signed   By: Marolyn JONETTA Jaksch M.D.   On: 11/23/2023 06:57   CT ABDOMEN PELVIS W CONTRAST Result Date: 11/23/2023 CLINICAL DATA:  Abdominal pain EXAM: CT ABDOMEN AND PELVIS WITH CONTRAST TECHNIQUE: Multidetector CT imaging of the abdomen and pelvis was performed using the standard protocol following bolus administration of intravenous contrast. RADIATION DOSE REDUCTION: This exam was performed according to the departmental dose-optimization program which includes automated exposure control, adjustment of the mA and/or kV according to patient size and/or use of iterative reconstruction technique. CONTRAST:  75mL OMNIPAQUE  IOHEXOL  350 MG/ML  SOLN COMPARISON:  None Available. FINDINGS: Lower chest: No acute abnormality. Hepatobiliary: No solid liver abnormality is seen. No gallstones, gallbladder wall thickening, or biliary dilatation. Pancreas: Unremarkable. No pancreatic ductal dilatation or surrounding inflammatory changes. Spleen: Normal in size without significant abnormality. Adrenals/Urinary Tract: Adrenal glands are unremarkable. Kidneys are normal, without renal calculi, solid lesion, or hydronephrosis. Bladder is unremarkable. Stomach/Bowel: Stomach is distended by ingested material. Appendix appears normal. Long segment wall thickening and mucosal hyperenhancement of the descending colon, sigmoid colon, and rectum (series 3, image 72). Vascular/Lymphatic: Scattered aortic atherosclerosis. No enlarged abdominal or pelvic lymph nodes. Reproductive: No mass or other significant abnormality. Other: No abdominal wall hernia or abnormality. No ascites. Musculoskeletal: No acute or significant osseous findings. IMPRESSION: 1. Long segment wall thickening and mucosal hyperenhancement of the descending colon, sigmoid colon, and rectum, consistent with nonspecific infectious, inflammatory, or ischemic colitis. 2. Stomach is distended by ingested material; possibly related to a large meal however correlate for gastroparesis or gastric outlet obstruction. Aortic Atherosclerosis (ICD10-I70.0). Electronically Signed   By: Marolyn JONETTA Jaksch M.D.   On: 11/23/2023 06:55     EKG: personally reviewed my interpretation is sinus bradycardia on initial EKG. Follow up EKG sinus rhythm   ASSESSMENT & PLAN:   Assessment & Plan by Problem: Principal Problem:   Syncope Active Problems:   Bradycardia   Abnormal CT of the head c/f demyelination   Fall  Tammy Carter is a 63 y.o. female (spanish primary language) with PMH of HTN, hypothyroidism, and GERD. who presented with syncope and abdominal pain and admitted for syncopal workup on hospital day  0  #Syncope #fall -from patients history, highly suspicious that abdominal pain caused vasovagal response in conjunction with decreased blood pressure medications due to having taken medication just before syncopal episode.  -patient experienced syncopal episode without pre-syncopal symptoms  -Ddx:  Cardiogenic causes Aortic stenosis: Echo without and structural or functional abnormality   Patient presented with bradycardia on admission. Suggesting loop recorder outpatient with PCP follow up.   Patient had negative orthostatic vitals  LDL 123 -- ASCVD risk: 5.1; suggest moderate intensity statin outpatient due to family hx, suggestive ischemic bowel, and microvascular disease of the brain.  TSH pending  Hypovolemic  + hemoccult test, H&H WNL Decreased sympathetics/autonomic dysfunction  Vasovagal  A1C 4.9  -Head CT did not demonstrate acute intracranial pathology   #Abdominal Pain (LUQ) #Hematochezia  -Ddx: Infectious Colitis vs Ischemic Colitis  Ischemic Colitis  Symptoms for 3 months of LUQ abdominal pain  patient has indications of sclerotic disease in Abdominal CT with scattered aortic atherosclerosis  CT angiography scheduled tomorrow due to recent CT with contrast  Infectious Colitis  No leukocytosis or  fever, lactic acid 1.8. CBC and CMP unremarkable  +FOBT, after inflammation goes down, get colonoscopy with GI outpatient.  Monitoring WBC, cessation of diarrhea, and food tolerance.  Low suspicion, D/ced Ciprofloxacin /Flagl -D/c pantoprazole    #Abnormal CT of the head c/f demyelination  -MRI with and without contrast  -suspicious for MS due to CT findings, could also be microvascular disease  -denied decreased strength, neuropathy, double vision, denied weakness in muscles throughout the day   #HTN -On Lisinopril  5 mg and Amlodipine 2.5 mg -Blood pressures systolic 130s-170s/diastolic 70s-80s  #Untreated Hypothyroidism (?) -found in previous chart, no labs to verify  at this point -TSH and free T4 pending  Best practice: Diet: Clear liquid VTE: SCDs Code: Full  Disposition planning: Prior to Admission Living Arrangement: Home, living with husband,support from daughters  Anticipated Discharge Location: Home  Dispo: Admit patient to Observation with expected length of stay less than 2 midnights.  Signed: Benuel Braun, DO Internal Medicine Resident  11/23/2023, 2:27 PM  Please contact IM Residency On-Call Pager at: [phone] or [phone].

## 2023-11-23 NOTE — Progress Notes (Signed)
 Echocardiogram 2D Echocardiogram has been performed.  Hamzah Savoca N Darral Rishel,RDCS 11/23/2023, 10:06 AM

## 2023-11-23 NOTE — ED Notes (Signed)
Pt attempted to provide urine sample, unable at this time.

## 2023-11-23 NOTE — ED Notes (Signed)
 Pt ambulated to restroom without incident.

## 2023-11-23 NOTE — ED Provider Notes (Signed)
 Received signout from previous provider, please see his note for complete H&P.  63 year old Spanish-speaking female presenting with complaint of abdominal pain.  She endorsed ongoing abdominal pain for the past several days and had a syncopal event last night.  She had a unwitnessed fall but was heard by her daughter.  She did not exhibit any symptoms of seizure.  She has been endorsed having recurrent loose stools mixed with blood but she denies any nausea or vomiting or urinary symptoms.  Today workup was remarkable for fecal occult blood positive.  Normal WBC normal H&H, urinalysis without signs of urinary tract infection.  Her head CT scan is without any acute intracranial pathology.  An abdominal pelvis CT scan demonstrate evidence of hyperenhancement of the descending sigmoid and rectum colonic region consistent with either nonspecific infectious, inflammatory, or ischemic colitis.  Given her presentation, I suspect this is likely to be infectious etiology therefore I have initiated antibiotic which includes Cipro  and Flagyl  and will have patient admitted for further care.  7:56 AM Appreciate consultation from internal medicine resident who agrees to see and will admit patient for further care.  BP (!) 168/76   Pulse (!) 56   Temp 97.7 F (36.5 C)   Resp 13   SpO2 100%   Results for orders placed or performed during the hospital encounter of 11/23/23  Lipase, blood   Collection Time: 11/23/23  3:29 AM  Result Value Ref Range   Lipase 27 11 - 51 U/L  Comprehensive metabolic panel   Collection Time: 11/23/23  3:29 AM  Result Value Ref Range   Sodium 134 (L) 135 - 145 mmol/L   Potassium 3.9 3.5 - 5.1 mmol/L   Chloride 102 98 - 111 mmol/L   CO2 26 22 - 32 mmol/L   Glucose, Bld 165 (H) 70 - 99 mg/dL   BUN 12 8 - 23 mg/dL   Creatinine, Ser 9.11 0.44 - 1.00 mg/dL   Calcium  9.3 8.9 - 10.3 mg/dL   Total Protein 7.4 6.5 - 8.1 g/dL   Albumin 4.1 3.5 - 5.0 g/dL   AST 24 15 - 41 U/L   ALT  17 0 - 44 U/L   Alkaline Phosphatase 66 38 - 126 U/L   Total Bilirubin 0.5 0.0 - 1.2 mg/dL   GFR, Estimated >39 >39 mL/min   Anion gap 6 5 - 15  CBC   Collection Time: 11/23/23  3:29 AM  Result Value Ref Range   WBC 4.9 4.0 - 10.5 K/uL   RBC 4.71 3.87 - 5.11 MIL/uL   Hemoglobin 14.2 12.0 - 15.0 g/dL   HCT 57.1 63.9 - 53.9 %   MCV 90.9 80.0 - 100.0 fL   MCH 30.1 26.0 - 34.0 pg   MCHC 33.2 30.0 - 36.0 g/dL   RDW 87.6 88.4 - 84.4 %   Platelets 195 150 - 400 K/uL   nRBC 0.0 0.0 - 0.2 %  CBC   Collection Time: 11/23/23  6:03 AM  Result Value Ref Range   WBC 8.4 4.0 - 10.5 K/uL   RBC 4.78 3.87 - 5.11 MIL/uL   Hemoglobin 14.4 12.0 - 15.0 g/dL   HCT 57.0 63.9 - 53.9 %   MCV 89.7 80.0 - 100.0 fL   MCH 30.1 26.0 - 34.0 pg   MCHC 33.6 30.0 - 36.0 g/dL   RDW 87.6 88.4 - 84.4 %   Platelets 194 150 - 400 K/uL   nRBC 0.0 0.0 - 0.2 %  Troponin  I (High Sensitivity)   Collection Time: 11/23/23  6:03 AM  Result Value Ref Range   Troponin I (High Sensitivity) 3 <18 ng/L  POC occult blood, ED   Collection Time: 11/23/23  6:04 AM  Result Value Ref Range   Fecal Occult Bld POSITIVE (A) NEGATIVE  Urinalysis, Routine w reflex microscopic -Urine, Clean Catch   Collection Time: 11/23/23  6:18 AM  Result Value Ref Range   Color, Urine AMBER (A) YELLOW   APPearance HAZY (A) CLEAR   Specific Gravity, Urine 1.023 1.005 - 1.030   pH 5.0 5.0 - 8.0   Glucose, UA NEGATIVE NEGATIVE mg/dL   Hgb urine dipstick NEGATIVE NEGATIVE   Bilirubin Urine NEGATIVE NEGATIVE   Ketones, ur 5 (A) NEGATIVE mg/dL   Protein, ur 30 (A) NEGATIVE mg/dL   Nitrite NEGATIVE NEGATIVE   Leukocytes,Ua NEGATIVE NEGATIVE   RBC / HPF 0-5 0 - 5 RBC/hpf   WBC, UA 0-5 0 - 5 WBC/hpf   Bacteria, UA NONE SEEN NONE SEEN   Squamous Epithelial / HPF 0-5 0 - 5 /HPF   Mucus PRESENT    Hyaline Casts, UA PRESENT    CT HEAD WO CONTRAST ( ) Result Date: 11/23/2023 CLINICAL DATA:  Head trauma, syncope EXAM: CT HEAD WITHOUT CONTRAST  TECHNIQUE: Contiguous axial images were obtained from the base of the skull through the vertex without intravenous contrast. RADIATION DOSE REDUCTION: This exam was performed according to the departmental dose-optimization program which includes automated exposure control, adjustment of the mA and/or kV according to patient size and/or use of iterative reconstruction technique. COMPARISON:  07/18/2016 FINDINGS: Brain: No evidence of acute infarction, hemorrhage, hydrocephalus, extra-axial collection or mass lesion/mass effect. Patchy deep and juxtacortical white matter hypodensity, similar to prior examination (series 4, image 18). Vascular: No hyperdense vessel or unexpected calcification. Skull: Normal. Negative for fracture or focal lesion. Sinuses/Orbits: No acute finding. Other: None. IMPRESSION: 1. No acute intracranial pathology. 2. Patchy deep and juxtacortical white matter hypodensity, similar to prior examination. This appearance suggests demyelinating disorder such as multiple sclerosis although could be related to hypertensive small-vessel white matter disease. Electronically Signed   By: Marolyn JONETTA Jaksch M.D.   On: 11/23/2023 06:57   CT ABDOMEN PELVIS W CONTRAST Result Date: 11/23/2023 CLINICAL DATA:  Abdominal pain EXAM: CT ABDOMEN AND PELVIS WITH CONTRAST TECHNIQUE: Multidetector CT imaging of the abdomen and pelvis was performed using the standard protocol following bolus administration of intravenous contrast. RADIATION DOSE REDUCTION: This exam was performed according to the departmental dose-optimization program which includes automated exposure control, adjustment of the mA and/or kV according to patient size and/or use of iterative reconstruction technique. CONTRAST:  75mL OMNIPAQUE  IOHEXOL  350 MG/ML SOLN COMPARISON:  None Available. FINDINGS: Lower chest: No acute abnormality. Hepatobiliary: No solid liver abnormality is seen. No gallstones, gallbladder wall thickening, or biliary dilatation.  Pancreas: Unremarkable. No pancreatic ductal dilatation or surrounding inflammatory changes. Spleen: Normal in size without significant abnormality. Adrenals/Urinary Tract: Adrenal glands are unremarkable. Kidneys are normal, without renal calculi, solid lesion, or hydronephrosis. Bladder is unremarkable. Stomach/Bowel: Stomach is distended by ingested material. Appendix appears normal. Long segment wall thickening and mucosal hyperenhancement of the descending colon, sigmoid colon, and rectum (series 3, image 72). Vascular/Lymphatic: Scattered aortic atherosclerosis. No enlarged abdominal or pelvic lymph nodes. Reproductive: No mass or other significant abnormality. Other: No abdominal wall hernia or abnormality. No ascites. Musculoskeletal: No acute or significant osseous findings. IMPRESSION: 1. Long segment wall thickening and mucosal hyperenhancement of the descending colon,  sigmoid colon, and rectum, consistent with nonspecific infectious, inflammatory, or ischemic colitis. 2. Stomach is distended by ingested material; possibly related to a large meal however correlate for gastroparesis or gastric outlet obstruction. Aortic Atherosclerosis (ICD10-I70.0). Electronically Signed   By: Marolyn JONETTA Jaksch M.D.   On: 11/23/2023 06:55       Nivia Colon, PA-C 11/23/23 [id number]    Dean Clarity, MD 11/23/23 [phone]

## 2023-11-23 NOTE — ED Notes (Signed)
 Pt returned from CT scan and ambulated to restroom without incident.

## 2023-11-23 NOTE — Hospital Course (Addendum)
 Name: Tammy Carter MRN: [id number] DOB: 31-Jul-1960 63 y.o. PCP: Shona Norleen PEDLAR, MD  Date of Admission: 11/23/2023  2:46 AM Date of Discharge: *** Attending Physician: {[email or web address]}  Discharge Diagnosis: 1. Principal Problem:   Syncope Active Problems:   Bradycardia   Abnormal CT of the head c/f demyelination   Fall  ***  Discharge Medications: Allergies as of 11/24/2023       Reactions   Atorvastatin Other (See Comments)   -myalgia   Ferrous Sulfate Palpitations     Med Rec must be completed prior to using this SMARTLINK***       Disposition and follow-up:   Tammy Carter was discharged from [facility] in {DISCHARGE CONDITION:19696} condition.  At the hospital follow up visit please address:  1.  ***  2.  Labs / imaging needed at time of follow-up: colonoscopy in 6-8 weeks  3.  Pending labs/ test needing follow-up: ***  Follow-up Appointments:    Hospital Course by problem list: Tammy Carter is a 63 y.o. person living with a history of *** who presented with *** and admitted for ***  now being discharged on hospital day 0 with the following pertinent hospital course:    Stable chronic medical conditions: *** *** ***   Discharge Exam:   BP 136/74 (BP Location: Right Arm)   Pulse (!) 54   Temp 98.1 F (36.7 C) (Oral)   Resp 18   Ht 5' 5 (1.651 m)   Wt 79.4 kg   SpO2 100%   BMI 29.12 kg/m  Discharge exam: ***  Pertinent Labs, Studies, and Procedures:     Latest Ref Rng & Units 11/24/2023    3:22 AM 11/23/2023    6:03 AM 11/23/2023    3:29 AM  CBC  WBC 4.0 - 10.5 K/uL 8.1  8.4  4.9   Hemoglobin 12.0 - 15.0 g/dL 85.9  85.5  85.7   Hematocrit 36.0 - 46.0 % 40.8  42.9  42.8   Platelets 150 - 400 K/uL 201  194  195        Latest Ref Rng & Units 11/24/2023    3:22 AM 11/23/2023    3:29 AM  CMP  Glucose 70 - 99 mg/dL 899  834   BUN 8 - 23 mg/dL <5  12   Creatinine 9.55 - 1.00 mg/dL 9.33  9.11    Sodium 864 - 145 mmol/L 137  134   Potassium 3.5 - 5.1 mmol/L 3.7  3.9   Chloride 98 - 111 mmol/L 107  102   CO2 22 - 32 mmol/L 24  26   Calcium  8.9 - 10.3 mg/dL 8.7  9.3   Total Protein 6.5 - 8.1 g/dL 6.2  7.4   Total Bilirubin 0.0 - 1.2 mg/dL 0.9  0.5   Alkaline Phos 38 - 126 U/L 64  66   AST 15 - 41 U/L 22  24   ALT 0 - 44 U/L 16  17     CT Angio Abd/Pel w/ and/or w/o Result Date: 11/24/2023 CLINICAL DATA:  Mesenteric ischemia, chronic, with colitis EXAM: CTA ABDOMEN AND PELVIS WITHOUT AND WITH CONTRAST TECHNIQUE: Multidetector CT imaging of the abdomen and pelvis was performed using the standard protocol during bolus administration of intravenous contrast. Multiplanar reconstructed images and MIPs were obtained and reviewed to evaluate the vascular anatomy. RADIATION DOSE REDUCTION: This exam was performed according to the departmental dose-optimization program which includes automated exposure control, adjustment of  the mA and/or kV according to patient size and/or use of iterative reconstruction technique. CONTRAST:  OMNIPAQUE  IOHEXOL  350 MG/ML SOLN COMPARISON:  11/23/2023 FINDINGS: VASCULAR Normal contour and caliber of the abdominal aorta. Minimal, scattered aortoiliac atherosclerosis. No evidence of aneurysm, dissection, or other acute aortic pathology. Standard branching pattern of the abdominal aorta with solitary bilateral renal arteries. The branch vessel origins are widely patent, with specific attention to the mesenteric arteries. Review of the MIP images confirms the above findings. NON-VASCULAR Lower Chest: No acute findings. Hepatobiliary: No solid liver abnormality is seen. No gallstones, gallbladder wall thickening, or biliary dilatation. Pancreas: Unremarkable. No pancreatic ductal dilatation or surrounding inflammatory changes. Spleen: Normal in size without significant abnormality. Adrenals/Urinary Tract: Adrenal glands are unremarkable. Kidneys are normal, without renal  calculi, solid lesion, or hydronephrosis. Bladder is unremarkable. Stomach/Bowel: Stomach is within normal limits. Appendix not clearly visualized. Long segment wall thickening of the descending colon, sigmoid colon, and rectum, generally similar in appearance to prior examination with increased adjacent fat stranding (series 8, image 141, 176). Lymphatic: No enlarged abdominal or pelvic lymph nodes. Reproductive: No mass or other significant abnormality. Other: No abdominal wall hernia or abnormality. No ascites. Musculoskeletal: No acute osseous findings. IMPRESSION: 1. Normal contour and caliber of the abdominal aorta. Minimal, scattered aortoiliac atherosclerosis. No evidence of aneurysm, dissection, or other acute aortic pathology. The branch vessel origins are widely patent, with specific attention to the mesenteric arteries. 2. Long segment wall thickening of the descending colon, sigmoid colon, and rectum, generally similar in appearance to prior examination with increased adjacent fat stranding. Electronically Signed   By: Marolyn JONETTA Jaksch M.D.   On: 11/24/2023 12:12   MR BRAIN W WO CONTRAST Result Date: 11/23/2023 CLINICAL DATA:  Initial evaluation for demyelinating disease. EXAM: MRI HEAD WITHOUT AND WITH CONTRAST TECHNIQUE: Multiplanar, multiecho pulse sequences of the brain and surrounding structures were obtained without and with intravenous contrast. CONTRAST:  7.5mL GADAVIST  GADOBUTROL  1 MMOL/ML IV SOLN COMPARISON:  CT from 11/23/2023. FINDINGS: Brain: Cerebral volume within normal limits. Patchy and confluent T2/FLAIR hyperintensity seen involving the periventricular, deep, and subcortical white matter both cerebral hemispheres. Mild patchy involvement of the cerebellum. Although nonspecific, overall appearance and distribution is favored to reflect sequelae of chronic microvascular ischemic disease. Although a demyelinating process is not entirely excluded, this is felt to be less likely. No  evidence for acute or subacute infarct. No areas of chronic cortical infarction. No acute or chronic intracranial blood products. No mass lesion, midline shift or mass effect. No hydrocephalus or extra-axial fluid collection. Pituitary gland within normal limits. No abnormal enhancement. Vascular: Major intracranial vascular flow voids are maintained. Skull and upper cervical spine: Craniocervical junction within normal limits. Bone marrow signal intensity normal. No scalp soft tissue abnormality. Sinuses/Orbits: Globes orbital soft tissues within normal limits. Paranasal sinuses are largely clear. No mastoid effusion. Other: None. IMPRESSION: 1. Patchy and confluent T2/FLAIR hyperintensity involving the supratentorial cerebral white matter, with mild patchy involvement of the cerebellum. Although nonspecific, overall appearance and distribution is favored to reflect sequelae of chronic microvascular ischemic disease. Although a demyelinating process is not entirely excluded, this is felt to be less likely. No abnormal enhancement. 2. No other acute intracranial abnormality. Electronically Signed   By: Morene Hoard M.D.   On: 11/23/2023 18:47   ECHOCARDIOGRAM COMPLETE Result Date: 11/23/2023    ECHOCARDIOGRAM REPORT   Patient Name:   Tammy Carter Date of Exam: 11/23/2023 Medical Rec #:  [id number]  Height:       65.0 in Accession #:    [id number]       Weight:       175.0 lb Date of Birth:  [birth year]        BSA:          1.869 m Patient Age:    63 years         BP:           152/68 mmHg Patient Gender: F                HR:           70 bpm. Exam Location:  Inpatient Procedure: 2D Echo, 3D Echo, Color Doppler, Cardiac Doppler and Strain Analysis            (Both Spectral and Color Flow Doppler were utilized during            procedure). Indications:    Syncope  History:        Patient has prior history of Echocardiogram examinations, most                 recent 04/16/2018. CAD, Arrythmias:SVT; Risk  Factors:Former                 Smoker, Dyslipidemia, Hypertension and Hypothyroidism.  Sonographer:    Logan Shove RDCS Referring Phys: [id number] ELSIE KATHEE SAVANNAH IMPRESSIONS  1. Left ventricular ejection fraction, by estimation, is 65 to 70%. Left ventricular ejection fraction by 3D volume is 69 %. The left ventricle has normal function. The left ventricle has no regional wall motion abnormalities. Left ventricular diastolic  parameters were normal. The average left ventricular global longitudinal strain is -23.8 %. The global longitudinal strain is normal.  2. Right ventricular systolic function is normal. The right ventricular size is normal. There is normal pulmonary artery systolic pressure. The estimated right ventricular systolic pressure is 20.3 mmHg.  3. The mitral valve is normal in structure. Trivial mitral valve regurgitation.  4. The aortic valve is tricuspid. Aortic valve regurgitation is trivial. No aortic stenosis is present.  5. The inferior vena cava is normal in size with greater than 50% respiratory variability, suggesting right atrial pressure of 3 mmHg. FINDINGS  Left Ventricle: Left ventricular ejection fraction, by estimation, is 65 to 70%. Left ventricular ejection fraction by 3D volume is 69 %. The left ventricle has normal function. The left ventricle has no regional wall motion abnormalities. The average left ventricular global longitudinal strain is -23.8 %. Strain was performed and the global longitudinal strain is normal. The left ventricular internal cavity size was normal in size. There is no left ventricular hypertrophy. Left ventricular diastolic parameters were normal. Right Ventricle: The right ventricular size is normal. No increase in right ventricular wall thickness. Right ventricular systolic function is normal. There is normal pulmonary artery systolic pressure. The tricuspid regurgitant velocity is 2.08 m/s, and  with an assumed right atrial pressure of 3 mmHg, the estimated  right ventricular systolic pressure is 20.3 mmHg. Left Atrium: Left atrial size was normal in size. Right Atrium: Right atrial size was normal in size. Pericardium: There is no evidence of pericardial effusion. Mitral Valve: The mitral valve is normal in structure. Trivial mitral valve regurgitation. Tricuspid Valve: The tricuspid valve is normal in structure. Tricuspid valve regurgitation is trivial. Aortic Valve: The aortic valve is tricuspid. Aortic valve regurgitation is trivial. No aortic stenosis is present. Aortic valve mean gradient measures 9.0 mmHg. Aortic  valve peak gradient measures 17.6 mmHg. Aortic valve area, by VTI measures 2.52 cm. Pulmonic Valve: The pulmonic valve was not well visualized. Pulmonic valve regurgitation is trivial. Aorta: The aortic root and ascending aorta are structurally normal, with no evidence of dilitation. Venous: The inferior vena cava is normal in size with greater than 50% respiratory variability, suggesting right atrial pressure of 3 mmHg. IAS/Shunts: The atrial septum is grossly normal. Additional Comments: 3D was performed not requiring image post processing on an independent workstation and was normal.  LEFT VENTRICLE PLAX 2D LVIDd:         4.40 cm         Diastology LVIDs:         2.50 cm         LV e' medial:    7.09 cm/s LV PW:         0.90 cm         LV E/e' medial:  12.8 LV IVS:        0.80 cm         LV e' lateral:   11.70 cm/s LVOT diam:     1.90 cm         LV E/e' lateral: 7.8 LV SV:         106 LV SV Index:   57              2D Longitudinal LVOT Area:     2.84 cm        Strain                                2D Strain GLS   -23.8 %                                Avg:                                 3D Volume EF                                LV 3D EF:    Left                                             ventricul                                             ar                                             ejection                                              fraction  by 3D                                             volume is                                             69 %.                                 3D Volume EF:                                3D EF:        69 %                                LV EDV:       113 ml                                LV ESV:       35 ml                                LV SV:        78 ml RIGHT VENTRICLE             IVC RV Basal diam:  3.10 cm     IVC diam: 1.30 cm RV S prime:     17.00 cm/s TAPSE (M-mode): 3.1 cm LEFT ATRIUM             Index        RIGHT ATRIUM           Index LA diam:        3.20 cm 1.71 cm/m   RA Area:     15.70 cm LA Vol (A2C):   64.8 ml 34.67 ml/m  RA Volume:   38.80 ml  20.76 ml/m LA Vol (A4C):   31.3 ml 16.75 ml/m LA Biplane Vol: 45.5 ml 24.34 ml/m  AORTIC VALVE AV Area (Vmax):    2.15 cm AV Area (Vmean):   1.92 cm AV Area (VTI):     2.52 cm AV Vmax:           210.00 cm/s AV Vmean:          134.000 cm/s AV VTI:            0.421 m AV Peak Grad:      17.6 mmHg AV Mean Grad:      9.0 mmHg LVOT Vmax:         159.00 cm/s LVOT Vmean:        90.700 cm/s LVOT VTI:          0.374 m LVOT/AV VTI ratio: 0.89  AORTA Ao Root diam: 3.10 cm Ao Asc diam:  3.60 cm MITRAL VALVE                TRICUSPID VALVE MV Area (PHT): 3.12 cm  TR Peak grad:   17.3 mmHg MV Decel Time: 243 msec     TR Vmax:        208.00 cm/s MV E velocity: 91.10 cm/s MV A velocity: 107.00 cm/s  SHUNTS MV E/A ratio:  0.85         Systemic VTI:  0.37 m                             Systemic Diam: 1.90 cm Lonni Nanas MD Electronically signed by Lonni Nanas MD Signature Date/Time: 11/23/2023/3:18:13 PM    Final    CT HEAD WO CONTRAST ( ) Result Date: 11/23/2023 CLINICAL DATA:  Head trauma, syncope EXAM: CT HEAD WITHOUT CONTRAST TECHNIQUE: Contiguous axial images were obtained from the base of the skull through the vertex without intravenous contrast. RADIATION DOSE REDUCTION: This exam was  performed according to the departmental dose-optimization program which includes automated exposure control, adjustment of the mA and/or kV according to patient size and/or use of iterative reconstruction technique. COMPARISON:  07/18/2016 FINDINGS: Brain: No evidence of acute infarction, hemorrhage, hydrocephalus, extra-axial collection or mass lesion/mass effect. Patchy deep and juxtacortical white matter hypodensity, similar to prior examination (series 4, image 18). Vascular: No hyperdense vessel or unexpected calcification. Skull: Normal. Negative for fracture or focal lesion. Sinuses/Orbits: No acute finding. Other: None. IMPRESSION: 1. No acute intracranial pathology. 2. Patchy deep and juxtacortical white matter hypodensity, similar to prior examination. This appearance suggests demyelinating disorder such as multiple sclerosis although could be related to hypertensive small-vessel white matter disease. Electronically Signed   By: Marolyn JONETTA Jaksch M.D.   On: 11/23/2023 06:57   CT ABDOMEN PELVIS W CONTRAST Result Date: 11/23/2023 CLINICAL DATA:  Abdominal pain EXAM: CT ABDOMEN AND PELVIS WITH CONTRAST TECHNIQUE: Multidetector CT imaging of the abdomen and pelvis was performed using the standard protocol following bolus administration of intravenous contrast. RADIATION DOSE REDUCTION: This exam was performed according to the departmental dose-optimization program which includes automated exposure control, adjustment of the mA and/or kV according to patient size and/or use of iterative reconstruction technique. CONTRAST:  75mL OMNIPAQUE  IOHEXOL  350 MG/ML SOLN COMPARISON:  None Available. FINDINGS: Lower chest: No acute abnormality. Hepatobiliary: No solid liver abnormality is seen. No gallstones, gallbladder wall thickening, or biliary dilatation. Pancreas: Unremarkable. No pancreatic ductal dilatation or surrounding inflammatory changes. Spleen: Normal in size without significant abnormality. Adrenals/Urinary  Tract: Adrenal glands are unremarkable. Kidneys are normal, without renal calculi, solid lesion, or hydronephrosis. Bladder is unremarkable. Stomach/Bowel: Stomach is distended by ingested material. Appendix appears normal. Long segment wall thickening and mucosal hyperenhancement of the descending colon, sigmoid colon, and rectum (series 3, image 72). Vascular/Lymphatic: Scattered aortic atherosclerosis. No enlarged abdominal or pelvic lymph nodes. Reproductive: No mass or other significant abnormality. Other: No abdominal wall hernia or abnormality. No ascites. Musculoskeletal: No acute or significant osseous findings. IMPRESSION: 1. Long segment wall thickening and mucosal hyperenhancement of the descending colon, sigmoid colon, and rectum, consistent with nonspecific infectious, inflammatory, or ischemic colitis. 2. Stomach is distended by ingested material; possibly related to a large meal however correlate for gastroparesis or gastric outlet obstruction. Aortic Atherosclerosis (ICD10-I70.0). Electronically Signed   By: Marolyn JONETTA Jaksch M.D.   On: 11/23/2023 06:55     Discharge Instructions:   Signed: Elnora Ip, MD 11/24/2023, 2:06 PM

## 2023-11-24 ENCOUNTER — Observation Stay (HOSPITAL_COMMUNITY)

## 2023-11-24 DIAGNOSIS — K551 Chronic vascular disorders of intestine: Secondary | ICD-10-CM | POA: Diagnosis not present

## 2023-11-24 DIAGNOSIS — R55 Syncope and collapse: Secondary | ICD-10-CM | POA: Diagnosis not present

## 2023-11-24 DIAGNOSIS — K529 Noninfective gastroenteritis and colitis, unspecified: Secondary | ICD-10-CM | POA: Diagnosis not present

## 2023-11-24 DIAGNOSIS — Q600 Renal agenesis, unilateral: Secondary | ICD-10-CM | POA: Diagnosis not present

## 2023-11-24 LAB — CBC WITH DIFFERENTIAL/PLATELET
Abs Immature Granulocytes: 0.01 K/uL (ref 0.00–0.07)
Basophils Absolute: 0 K/uL (ref 0.0–0.1)
Basophils Relative: 0 %
Eosinophils Absolute: 0.3 K/uL (ref 0.0–0.5)
Eosinophils Relative: 4 %
HCT: 40.8 % (ref 36.0–46.0)
Hemoglobin: 14 g/dL (ref 12.0–15.0)
Immature Granulocytes: 0 %
Lymphocytes Relative: 20 %
Lymphs Abs: 1.6 K/uL (ref 0.7–4.0)
MCH: 30.3 pg (ref 26.0–34.0)
MCHC: 34.3 g/dL (ref 30.0–36.0)
MCV: 88.3 fL (ref 80.0–100.0)
Monocytes Absolute: 0.7 K/uL (ref 0.1–1.0)
Monocytes Relative: 9 %
Neutro Abs: 5.4 K/uL (ref 1.7–7.7)
Neutrophils Relative %: 67 %
Platelets: 201 K/uL (ref 150–400)
RBC: 4.62 MIL/uL (ref 3.87–5.11)
RDW: 12.4 % (ref 11.5–15.5)
WBC: 8.1 K/uL (ref 4.0–10.5)
nRBC: 0 % (ref 0.0–0.2)

## 2023-11-24 LAB — COMPREHENSIVE METABOLIC PANEL WITH GFR
ALT: 16 U/L (ref 0–44)
AST: 22 U/L (ref 15–41)
Albumin: 3.5 g/dL (ref 3.5–5.0)
Alkaline Phosphatase: 64 U/L (ref 38–126)
Anion gap: 6 (ref 5–15)
BUN: <5 mg/dL — ABNORMAL LOW (ref 8–23)
CO2: 24 mmol/L (ref 22–32)
Calcium: 8.7 mg/dL — ABNORMAL LOW (ref 8.9–10.3)
Chloride: 107 mmol/L (ref 98–111)
Creatinine, Ser: 0.66 mg/dL (ref 0.44–1.00)
GFR, Estimated: >60 mL/min (ref >60)
Glucose, Bld: 100 mg/dL — ABNORMAL HIGH (ref 70–99)
Potassium: 3.7 mmol/L (ref 3.5–5.1)
Sodium: 137 mmol/L (ref 135–145)
Total Bilirubin: 0.9 mg/dL (ref 0.0–1.2)
Total Protein: 6.2 g/dL — ABNORMAL LOW (ref 6.5–8.1)

## 2023-11-24 LAB — GLUCOSE, CAPILLARY: Glucose-Capillary: 92 mg/dL (ref 70–99)

## 2023-11-24 MED ORDER — IOHEXOL 350 MG/ML SOLN
100.0000 mL | Freq: Once | INTRAVENOUS | Status: AC | PRN
  Administered 2023-11-24: 100 mL via INTRAVENOUS

## 2023-11-24 MED ORDER — METRONIDAZOLE 500 MG/100ML IV SOLN
500.0000 mg | Freq: Two times a day (BID) | INTRAVENOUS | Status: DC
  Administered 2023-11-24 – 2023-11-26 (×5): 500 mg via INTRAVENOUS
  Filled 2023-11-24 (×5): qty 100

## 2023-11-24 MED ORDER — ASPIRIN 81 MG PO CHEW
81.0000 mg | CHEWABLE_TABLET | Freq: Every day | ORAL | Status: DC
  Administered 2023-11-24: 81 mg via ORAL
  Filled 2023-11-24: qty 1

## 2023-11-24 MED ORDER — SODIUM CHLORIDE 0.9 % IV SOLN
2.0000 g | INTRAVENOUS | Status: DC
  Administered 2023-11-24 – 2023-11-26 (×3): 2 g via INTRAVENOUS
  Filled 2023-11-24 (×3): qty 20

## 2023-11-24 MED ORDER — ROSUVASTATIN CALCIUM 20 MG PO TABS
20.0000 mg | ORAL_TABLET | Freq: Every day | ORAL | Status: DC
  Administered 2023-11-24 – 2023-11-25 (×2): 20 mg via ORAL
  Filled 2023-11-24 (×2): qty 1

## 2023-11-24 NOTE — Progress Notes (Signed)
 HD#0 SUBJECTIVE:  Patient Summary: Tammy Carter is a 63 y.o. (spanish primary language) with a pertinent PMH of HTN and GERD, who presented with syncope and abdominal pain and admitted for syncope and abdominal pain. workup with CT abdomen/pelvis demonstrating long segment wall thickening and mucosal hyper enhancement of the descending colon, sigmoid colon, and rectum consistent with nonspecific infectious, inflammatory, or ischemic colitis. CT and MR brain suggest microvascular changes with no acute intracranial pathology.   Overnight Events and Interim History: Bedside: Daughter in room; Dr. Kristy translating. Patient denied abdominal pain or diarrhea. We went over imaging from 7/6 and explained why she will get CTAngio of abdomen. HIV Ab resulted positive and patient was counseled at bedside. Patient's daughter mentioned that patient has only had one sexual partner (her husband), but that the patient's husband may or may not have cheated several years prior. Further labs are pending. We mentioned that ID will be following up if positive so she can receive treatment.    OBJECTIVE:  Vital Signs: Vitals:   11/23/23 1624 11/23/23 2113 11/24/23 0426 11/24/23 0920  BP: (!) 113/57 131/77 133/78 136/74  Pulse: 62 68 (!) 57 (!) 54  Resp:  18 18 18   Temp: 98.1 F (36.7 C) 98.4 F (36.9 C) 98.4 F (36.9 C) 98.1 F (36.7 C)  TempSrc:  Oral Oral Oral  SpO2: 100% 99% 100%   Weight:      Height:       Supplemental O2: Room Air SpO2: 100 %  Filed Weights   11/23/23 0921  Weight: 79.4 kg     Intake/Output Summary (Last 24 hours) at 11/24/2023 1101 Last data filed at 11/23/2023 1700 Gross per 24 hour  Intake 100 ml  Output --  Net 100 ml   Net IO Since Admission: 1,200 mL [11/24/23 1101]  Physical Exam: Physical Exam Constitutional:      Appearance: She is well-developed.  Cardiovascular:     Rate and Rhythm: Normal rate and regular rhythm.     Heart sounds: Murmur (systolic  murmur loudest over right 2nd ICS) heard.  Pulmonary:     Effort: Pulmonary effort is normal.     Breath sounds: Normal breath sounds.  Abdominal:     General: Bowel sounds are normal.     Palpations: Abdomen is soft.  Skin:    General: Skin is warm and dry.  Neurological:     Mental Status: She is alert.     Patient Lines/Drains/Airways Status     Active Line/Drains/Airways     Name Placement date Placement time Site Days   Peripheral IV 11/23/23 20 G Anterior;Proximal;Right Forearm 11/23/23  0600  Forearm  1            Pertinent labs and imaging:  HIV Ab reactive      Latest Ref Rng & Units 11/24/2023    3:22 AM 11/23/2023    6:03 AM 11/23/2023    3:29 AM  CBC  WBC 4.0 - 10.5 K/uL 8.1  8.4  4.9   Hemoglobin 12.0 - 15.0 g/dL 85.9  85.5  85.7   Hematocrit 36.0 - 46.0 % 40.8  42.9  42.8   Platelets 150 - 400 K/uL 201  194  195        Latest Ref Rng & Units 11/24/2023    3:22 AM 11/23/2023    3:29 AM  CMP  Glucose 70 - 99 mg/dL 899  834   BUN 8 - 23 mg/dL <5  12   Creatinine 0.44 - 1.00 mg/dL 9.33  9.11   Sodium 864 - 145 mmol/L 137  134   Potassium 3.5 - 5.1 mmol/L 3.7  3.9   Chloride 98 - 111 mmol/L 107  102   CO2 22 - 32 mmol/L 24  26   Calcium  8.9 - 10.3 mg/dL 8.7  9.3   Total Protein 6.5 - 8.1 g/dL 6.2  7.4   Total Bilirubin 0.0 - 1.2 mg/dL 0.9  0.5   Alkaline Phos 38 - 126 U/L 64  66   AST 15 - 41 U/L 22  24   ALT 0 - 44 U/L 16  17     MR BRAIN W WO CONTRAST Result Date: 11/23/2023 CLINICAL DATA:  Initial evaluation for demyelinating disease. EXAM: MRI HEAD WITHOUT AND WITH CONTRAST TECHNIQUE: Multiplanar, multiecho pulse sequences of the brain and surrounding structures were obtained without and with intravenous contrast. CONTRAST:  7.5mL GADAVIST  GADOBUTROL  1 MMOL/ML IV SOLN COMPARISON:  CT from 11/23/2023. FINDINGS: Brain: Cerebral volume within normal limits. Patchy and confluent T2/FLAIR hyperintensity seen involving the periventricular, deep, and  subcortical white matter both cerebral hemispheres. Mild patchy involvement of the cerebellum. Although nonspecific, overall appearance and distribution is favored to reflect sequelae of chronic microvascular ischemic disease. Although a demyelinating process is not entirely excluded, this is felt to be less likely. No evidence for acute or subacute infarct. No areas of chronic cortical infarction. No acute or chronic intracranial blood products. No mass lesion, midline shift or mass effect. No hydrocephalus or extra-axial fluid collection. Pituitary gland within normal limits. No abnormal enhancement. Vascular: Major intracranial vascular flow voids are maintained. Skull and upper cervical spine: Craniocervical junction within normal limits. Bone marrow signal intensity normal. No scalp soft tissue abnormality. Sinuses/Orbits: Globes orbital soft tissues within normal limits. Paranasal sinuses are largely clear. No mastoid effusion. Other: None. IMPRESSION: 1. Patchy and confluent T2/FLAIR hyperintensity involving the supratentorial cerebral white matter, with mild patchy involvement of the cerebellum. Although nonspecific, overall appearance and distribution is favored to reflect sequelae of chronic microvascular ischemic disease. Although a demyelinating process is not entirely excluded, this is felt to be less likely. No abnormal enhancement. 2. No other acute intracranial abnormality. Electronically Signed   By: Morene Hoard M.D.   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 Prior to fall and LOC, she had significantly worse abdominal pain and took her blood pressure medications. Syncopal episode likely caused vasovagal response in conjunction with decreased blood  pressure -Echo unremarkable   #Abdominal Pain  Patient presented to ED with LUQ abdominal pain going on for 3 months but increasing in severity the night of admission to hospital. CT abdomen/pelvis demonstrating long segment wall thickening and mucosal hyper enhancement of the descending colon, sigmoid colon, and rectum consistent with nonspecific infectious, inflammatory, or ischemic colitis and atherosclerosis of aorta. Last colonoscopy 2007 demonstrated polyps. Last cologaurd 2024 was negative. No current diarrheal stools or abdominal pain  -F/u outpatient GI for colonoscopy  -CT Angiogram scheduled for today -low suspicion of infectious colitis (no leukocytosis, lactic acid 1.8, CBC and CMP unremarkable.)  #HTN Patient has history of HTN  -on lisinopril  5 mg  -amlodipine 2.5 mg  #Bradycardia Patient presented to the ED with bradycardia and  has maintained bradycardia around 50-60s. -stable and asymptomatic  -TSH WNL -Loop recorder outpatient f/u with PCP due to syncopal episode   #Abnormal CT of the Head c/f demyelination  Patients MRI and CT of head suggest microvascular changes > demyelinating disease.  -starting Crestor  now, will continue outpatient -starting ASA 81 mg now, will continue outpatient  -denied decreased strength, neuropathy, double vision, denied weakness in muscles throughout the day   #HIV ab Reactive  HIV Ab resulted positive and patient was counseled at bedside. Patient's daughter mentioned that patient has only had one sexual partner (her husband), but that the patient's husband may or may not have cheated several years prior. Further labs are pending. We mentioned that ID will be following up if positive so she can receive treatment.  -pending further labs    Best Practice: Diet: Cardiac diet VTE: enoxaparin  (LOVENOX ) injection 40 mg Start: 11/23/23 2030 SCDs Start: 11/23/23 1938 SCDs Start: 11/23/23 9166 Code: Full  Disposition planning: Therapy Recs:  None, DME: none Family Contact: Daughters, at bedside. DISPO: continuing workup for ischemic bowel. Patient is stable and could discharge in 1-2 days. Also pending further HIV workup  Signature:  Sallyanne Primas, DO [facility]  11:01 AM, 11/24/2023  On Call pager [phone]

## 2023-11-24 NOTE — Plan of Care (Signed)

## 2023-11-25 DIAGNOSIS — K529 Noninfective gastroenteritis and colitis, unspecified: Principal | ICD-10-CM

## 2023-11-25 DIAGNOSIS — R55 Syncope and collapse: Secondary | ICD-10-CM | POA: Diagnosis not present

## 2023-11-25 LAB — CBC
HCT: 42.4 % (ref 36.0–46.0)
Hemoglobin: 14.1 g/dL (ref 12.0–15.0)
MCH: 30.5 pg (ref 26.0–34.0)
MCHC: 33.3 g/dL (ref 30.0–36.0)
MCV: 91.6 fL (ref 80.0–100.0)
Platelets: 167 K/uL (ref 150–400)
RBC: 4.63 MIL/uL (ref 3.87–5.11)
RDW: 12.6 % (ref 11.5–15.5)
WBC: 7.6 K/uL (ref 4.0–10.5)
nRBC: 0 % (ref 0.0–0.2)

## 2023-11-25 LAB — GASTROINTESTINAL PANEL BY PCR, STOOL (REPLACES STOOL CULTURE)

## 2023-11-25 LAB — BASIC METABOLIC PANEL WITH GFR
Anion gap: 8 (ref 5–15)
BUN: <5 mg/dL — ABNORMAL LOW (ref 8–23)
CO2: 27 mmol/L (ref 22–32)
Calcium: 9.3 mg/dL (ref 8.9–10.3)
Chloride: 106 mmol/L (ref 98–111)
Creatinine, Ser: 0.63 mg/dL (ref 0.44–1.00)
GFR, Estimated: >60 mL/min (ref >60)
Glucose, Bld: 99 mg/dL (ref 70–99)
Potassium: 4.6 mmol/L (ref 3.5–5.1)
Sodium: 141 mmol/L (ref 135–145)

## 2023-11-25 MED ORDER — PANTOPRAZOLE SODIUM 40 MG PO TBEC
40.0000 mg | DELAYED_RELEASE_TABLET | Freq: Every day | ORAL | Status: DC
  Administered 2023-11-25 – 2023-11-26 (×2): 40 mg via ORAL
  Filled 2023-11-25 (×2): qty 1

## 2023-11-25 MED ORDER — LISINOPRIL 10 MG PO TABS
10.0000 mg | ORAL_TABLET | Freq: Every day | ORAL | Status: DC
  Administered 2023-11-26: 10 mg via ORAL
  Filled 2023-11-25: qty 1

## 2023-11-25 NOTE — Progress Notes (Signed)
 Mobility Specialist: Progress Note   11/25/23 1134  Mobility  Activity Ambulated independently in hallway  Level of Assistance Independent  Assistive Device None  Distance Ambulated (ft) 600 ft (171ft x4)  Activity Response Tolerated well  Mobility visit 1 Mobility  Mobility Specialist Start Time (ACUTE ONLY) 1120  Mobility Specialist Stop Time (ACUTE ONLY) 1131  Mobility Specialist Time Calculation (min) (ACUTE ONLY) 11 min    During Mobility (Seated EOB): HR 67-69  (Ambulation): HR 70-72  Pt received in bed, agreeable to mobility session. Daughter present and able to translate. Ind throughout. No complaints. Ambulated 4 laps around the hallway. Left on EOB with all needs met, call bell in reach.   Ileana Lute Mobility Specialist Please contact via SecureChat or Rehab office at [phone]

## 2023-11-25 NOTE — Plan of Care (Signed)
   Problem: Education: Goal: Knowledge of General Education information will improve Description Including pain rating scale, medication(s)/side effects and non-pharmacologic comfort measures Outcome: Progressing

## 2023-11-25 NOTE — Progress Notes (Addendum)
 HD#0 SUBJECTIVE:  Patient Summary: Tammy Carter is a 63 y.o. (spanish primary language) with a pertinent PMH of HTN and GERD, who presented with syncope and abdominal pain and admitted for syncope and abdominal pain. workup with CT abdomen/pelvis demonstrating long segment wall thickening and mucosal hyper enhancement of the descending colon, sigmoid colon, and rectum consistent with nonspecific infectious, inflammatory, or ischemic colitis. CTA Abdomen suggests normal contour and caliber of the abdominal aorta and branch vessels are widely patent. CT and MR brain suggest microvascular changes with no acute intracranial pathology.   Overnight Events and Interim History: CTA Abdomen suggests normal contour and caliber of the abdominal aorta and branch vessels are widely patent. Overnight patient used the restroom and while urinating, had dark red, not black coagulated blood without stool in the toilet from rectum. At bedside, (translated with Dr. Kristy and daughter) patient endorsed some periumbilical abdominal pain and burping but denied LLQ pain. Patient denied diarrhea. Patient's daughter also mentioned that she has been taking her blood pressure medication while in the hospital, which had not been known or documented.    OBJECTIVE:  Vital Signs: Vitals:   11/24/23 2015 11/25/23 0024 11/25/23 0554 11/25/23 0804  BP: 120/70 138/74 (!) 155/70 (!) 142/71  Pulse: 63 (!) 58 (!) 50 (!) 48  Resp:  17 15 19   Temp: 97.9 F (36.6 C) 98 F (36.7 C) 97.7 F (36.5 C)   TempSrc: Oral     SpO2: 97% 99% 100% 100%  Weight:      Height:       Supplemental O2: Room Air SpO2: 100 %  Filed Weights   11/23/23 0921  Weight: 79.4 kg     Intake/Output Summary (Last 24 hours) at 11/25/2023 1323 Last data filed at 11/25/2023 0059 Gross per 24 hour  Intake 199.53 ml  Output --  Net 199.53 ml   Net IO Since Admission: 1,399.53 mL [11/25/23 1323]  Physical Exam: Physical Exam Constitutional:       Appearance: She is well-developed.  Cardiovascular:     Rate and Rhythm: Normal rate and regular rhythm.     Heart sounds: Murmur (systolic murmur loudest over right 2nd ICS) heard.  Pulmonary:     Effort: Pulmonary effort is normal.     Breath sounds: Normal breath sounds.  Abdominal:     General: Bowel sounds are normal.     Palpations: Abdomen is soft.  Skin:    General: Skin is warm and dry.  Neurological:     Mental Status: She is alert.    Patient Lines/Drains/Airways Status     Active Line/Drains/Airways     Name Placement date Placement time Site Days   Peripheral IV 11/23/23 20 G Anterior;Proximal;Right Forearm 11/23/23  0600  Forearm  1            Pertinent labs and imaging:  HIV Ab reactive      Latest Ref Rng & Units 11/25/2023    4:33 AM 11/24/2023    3:22 AM 11/23/2023    6:03 AM  CBC  WBC 4.0 - 10.5 K/uL 7.6  8.1  8.4   Hemoglobin 12.0 - 15.0 g/dL 85.8  85.9  85.5   Hematocrit 36.0 - 46.0 % 42.4  40.8  42.9   Platelets 150 - 400 K/uL 167  201  194        Latest Ref Rng & Units 11/25/2023    4:33 AM 11/24/2023    3:22 AM 11/23/2023  3:29 AM  CMP  Glucose 70 - 99 mg/dL 99  899  834   BUN 8 - 23 mg/dL <5  <5  12   Creatinine 0.44 - 1.00 mg/dL 9.36  9.33  9.11   Sodium 135 - 145 mmol/L 141  137  134   Potassium 3.5 - 5.1 mmol/L 4.6  3.7  3.9   Chloride 98 - 111 mmol/L 106  107  102   CO2 22 - 32 mmol/L 27  24  26    Calcium  8.9 - 10.3 mg/dL 9.3  8.7  9.3   Total Protein 6.5 - 8.1 g/dL  6.2  7.4   Total Bilirubin 0.0 - 1.2 mg/dL  0.9  0.5   Alkaline Phos 38 - 126 U/L  64  66   AST 15 - 41 U/L  22  24   ALT 0 - 44 U/L  16  17     No results found.   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 Prior to fall and LOC, she had significantly  worse abdominal pain and took her blood pressure medications. Syncopal episode likely caused vasovagal response in conjunction with decreased blood pressure -Echo unremarkable   #Abdominal Pain  Patient presented to ED with LUQ abdominal pain going on for 3 months but increasing in severity the night of admission to hospital. CT abdomen/pelvis demonstrating long segment wall thickening and mucosal hyper enhancement of the descending colon, sigmoid colon, and rectum consistent with nonspecific infectious, inflammatory, or ischemic colitis and atherosclerosis of aorta. Last colonoscopy 2007 demonstrated polyps. Last cologaurd 2024 was negative. No current diarrheal stools or abdominal pain  CTA Abdomen suggests normal contour and caliber of the abdominal aorta and branch vessels are widely patent.  Likely cause of abdominal pain and bloody diarrhea now is diverticulitis. Blood described as dark red, not black Patient endorsed periumbilical pain and burping -Started ceftriaxone  (2 g 100 ml started 7/6) and metronidazole  (500 mg started 7/6) -d/c ASA, started on SCDs -on low sodium clear liquid diet, will advance as tolerated  -started pantoprazole  for periumbilical pain and burping -F/u outpatient GI for colonoscopy  -low suspicion of infectious colitis (no leukocytosis, lactic acid 1.8, CBC and CMP unremarkable.)  #Bradycardia Patient presented to the ED with bradycardia and has maintained bradycardia around 50-60s. -stable and asymptomatic -Seated EOB HR: 67-69;ambulation HR: 70-72. Increased by 5 bpm which is WNL for seated to standing.  -Orthostatic vitals negative  -do not recommend beta blocker for this patient in the future.  -TSH WNL -Loop recorder outpatient f/u with PCP due to syncopal episode   #HTN Patient has history of HTN. Was taking home medications without hospital knowledge. Last dose was this AM of home meds lisinopril  5 mg and amlodipine 2.5 mg. Patient has had slight  increased of BP even on home meds (155/70) -pausing home meds  -starting lisinopril  10 mg starting tomorrow. 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  -starting Crestor  now, will continue outpatient -d/c ASA now with blood in toilet  -denied decreased strength, neuropathy, double vision, denied weakness in muscles throughout the day   #HIV ab Reactive  HIV Ab resulted positive and patient was counseled at bedside. Patient's daughter mentioned that patient has only had one sexual partner (her husband), but that the patient's husband may or may not have cheated several years prior. Further labs are pending. We mentioned that ID will be following up if positive so she can receive treatment.  -pending further labs    Best Practice: Diet: clear liquid, low sodium diet VTE: Place and maintain sequential compression device Start: 11/25/23 0953 SCDs Start: 11/23/23 1938 SCDs Start: 11/23/23 9166 Code: Full  Disposition planning: Therapy Recs: None, DME: none Family Contact: Daughters, at bedside. DISPO: Receiving IV antibiotics for diverticulitis. Pending further HIV workup  Signature:  Sallyanne Primas, DO [facility]  1:23 PM, 11/25/2023  On Call pager [phone]

## 2023-11-25 NOTE — Plan of Care (Signed)
°  Problem: Education: °Goal: Knowledge of General Education information will improve °Description: Including pain rating scale, medication(s)/side effects and non-pharmacologic comfort measures °Outcome: Progressing °  °Problem: Health Behavior/Discharge Planning: °Goal: Ability to manage health-related needs will improve °Outcome: Progressing °  °Problem: Clinical Measurements: °Goal: Ability to maintain clinical measurements within normal limits will improve °Outcome: Progressing °Goal: Will remain free from infection °Outcome: Progressing °Goal: Diagnostic test results will improve °Outcome: Progressing °Goal: Respiratory complications will improve °Outcome: Progressing °Goal: Cardiovascular complication will be avoided °Outcome: Progressing °  °Problem: Safety: °Goal: Ability to remain free from injury will improve °Outcome: Progressing °  °Problem: Skin Integrity: °Goal: Risk for impaired skin integrity will decrease °Outcome: Progressing °  °

## 2023-11-26 ENCOUNTER — Other Ambulatory Visit: Payer: Self-pay

## 2023-11-26 DIAGNOSIS — K529 Noninfective gastroenteritis and colitis, unspecified: Secondary | ICD-10-CM | POA: Diagnosis not present

## 2023-11-26 DIAGNOSIS — R55 Syncope and collapse: Secondary | ICD-10-CM | POA: Diagnosis not present

## 2023-11-26 LAB — BASIC METABOLIC PANEL WITH GFR
Anion gap: 9 (ref 5–15)
BUN: 5 mg/dL — ABNORMAL LOW (ref 8–23)
CO2: 24 mmol/L (ref 22–32)
Calcium: 8.9 mg/dL (ref 8.9–10.3)
Chloride: 106 mmol/L (ref 98–111)
Creatinine, Ser: 0.53 mg/dL (ref 0.44–1.00)
GFR, Estimated: >60 mL/min (ref >60)
Glucose, Bld: 88 mg/dL (ref 70–99)
Potassium: 4 mmol/L (ref 3.5–5.1)
Sodium: 139 mmol/L (ref 135–145)

## 2023-11-26 LAB — CBC
HCT: 38.3 % (ref 36.0–46.0)
Hemoglobin: 12.9 g/dL (ref 12.0–15.0)
MCH: 30 pg (ref 26.0–34.0)
MCHC: 33.7 g/dL (ref 30.0–36.0)
MCV: 89.1 fL (ref 80.0–100.0)
Platelets: 189 K/uL (ref 150–400)
RBC: 4.3 MIL/uL (ref 3.87–5.11)
RDW: 12.3 % (ref 11.5–15.5)
WBC: 6.3 K/uL (ref 4.0–10.5)
nRBC: 0 % (ref 0.0–0.2)

## 2023-11-26 LAB — GLUCOSE, CAPILLARY: Glucose-Capillary: 81 mg/dL (ref 70–99)

## 2023-11-26 MED ORDER — PANTOPRAZOLE SODIUM 40 MG PO TBEC: 40.0000 mg | DELAYED_RELEASE_TABLET | Freq: Every day | ORAL | 0 refills | Status: DC

## 2023-11-26 MED ORDER — AMOXICILLIN-POT CLAVULANATE 875-125 MG PO TABS: 1.0000 | ORAL_TABLET | Freq: Two times a day (BID) | ORAL | 0 refills | Status: DC

## 2023-11-26 MED ORDER — ROSUVASTATIN CALCIUM 20 MG PO TABS: 20.0000 mg | ORAL_TABLET | Freq: Every day | ORAL | 0 refills | Status: DC

## 2023-11-26 MED ORDER — ACETAMINOPHEN 325 MG PO TABS: 650.0000 mg | ORAL_TABLET | Freq: Four times a day (QID) | ORAL | 0 refills | Status: DC | PRN

## 2023-11-26 MED ORDER — LISINOPRIL 10 MG PO TABS: 10.0000 mg | ORAL_TABLET | Freq: Every day | ORAL | 0 refills | Status: DC

## 2023-11-26 NOTE — Discharge Instructions (Signed)
 Thank you for allowing us  to be part of your care. You were hospitalized for 3 days for abdominal pain and workup for passing out. We treated you with antibiotics.   See the changes in your medications and management of your chronic conditions below:  *For your Syncope  -We have CHANGED your Lisinopril  5 mg daily to Lisinopril  10 mg daily -We have STOPPED your Amlodipine 2.5 mg  Your syncope (passing out) was likely the result of severe abdominal pain causing a vaso-vagal response (sudden drop in heart rate and blood pressure). You had also just taken your blood pressure medication prior to passing out. For this reason, we have taken you off of your Amlodipine and just increased your Lisinopril .    *For your Abdominal Pain / Diverticulitis  -We have STARTED you on these following medications:  - Antibiotic: Augmentin  (Amoxicillin -clavulanate) 875 mg every 12 hours for 7 days.             -Tylenol  as needed for pain (every 6 hours)            - Pantoprazole  40 mg daily (helps with acid reflux/burping)  Please see [facility] or Dr. Shona on Friday November 29, 2023.  You will also need a colonoscopy 6 weeks after discharge - this will be arranged at your clinic follow up.  Please make sure to discuss with the primary care doctor if you have had any more bloody stool, diarrhea, or abdominal pain. If you pass out again or have a severe diarrhea, abdominal pain, or large amount of blood in stool, please go to the ED.    Please discuss arranging for a colonoscopy 6 weeks after your discharge.   HIV confirmatory labs are still pending. When those labs come back, you will receive a phone call from one of the [facility] Doctors that have seen you during this hospital stay. We will discuss your options and refer you to an Infectious Disease specialist.   Our clinic and after hours phone number is [phone]. The best time to call is Monday through Friday 9  am to 4 pm but there is always someone available 24/7.  We are glad you are feeling better,  Tammy Primas, DO [facility] at [facility]

## 2023-11-26 NOTE — Plan of Care (Signed)
  Problem: Education: Goal: Knowledge of General Education information will improve Description: Including pain rating scale, medication(s)/side effects and non-pharmacologic comfort measures Outcome: Adequate for Discharge   

## 2023-11-26 NOTE — Discharge Summary (Cosign Needed)
 Name: Tammy Carter MRN: [id number] DOB: 1961/03/03 63 y.o. PCP: Shona Norleen PEDLAR, MD  Date of Admission: 11/23/2023  2:46 AM Date of Discharge: 11/26/2023 Attending Physician: Dr. MICAEL Riis Winfrey  Discharge Diagnosis: 1. 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 Commonly known as: NORVASC Take 2.5 mg by mouth daily.   ferrous sulfate 325 (65 FE) MG tablet Wait to take this until your doctor or other care provider tells you to start again. Take 325 mg by mouth daily with breakfast.       TAKE these medications    acetaminophen  325 MG tablet Commonly known as: TYLENOL  Take 2 tablets (650 mg total) by mouth every 6 (six) hours as needed for mild pain (pain score 1-3) or fever (or Fever >/= 101).   amoxicillin -clavulanate 875-125 MG tablet Commonly known as: AUGMENTIN  Take 1 tablet by mouth 2 (two) times daily.   CALCIUM  500 PO Take 1 tablet by mouth daily.   lisinopril  10 MG tablet Commonly known as: ZESTRIL  Take 1 tablet (10 mg total) by mouth daily. Start taking on: November 27, 2023   pantoprazole  40 MG tablet Commonly known as: PROTONIX  Take 1 tablet (40 mg total) by mouth daily. Start taking on: November 27, 2023   rosuvastatin  20 MG tablet Commonly known as: CRESTOR  Take 1 tablet (20 mg total) by mouth at bedtime.   WOMENS MULTIVITAMIN PO Take by mouth daily.   VITAMIN D3 COMPLETE PO Take 1 tablet by mouth daily.        Disposition and follow-up:   Tammy Carter was discharged from [facility] in Good  condition.  At the hospital follow up visit please address:  Colitis/Diverticulitis: will need colonoscopy 6 weeks after discharge. Currently on Augmentin    Positive HIV ab: confirmatory tests pending Bradycardia: Patient consistently bradycardic (50-60s). Seated EOB HR 67-69, ambulation HR 70-72. Orthostatic labs were negative. TSH WNL, Consider holter monitor due to syncopal episode  HTN: increased lisinopril  to 10 mg and paused amlodipine 2.5 mg. Repeat BP, manage   Abnormal CT of head c/f demyelination: likely microvascular changes  2.  Labs / imaging needed at time of follow-up: Colonoscopy (6 weeks post discharge), Holter monitor   3.  Pending labs/ test needing follow-up: HIV 1/2 antibody differentiation, RPR, ova-parasite exam  Follow-up Appointments:  Follow-up Information     Shona Norleen PEDLAR, MD. Schedule an appointment as soon as possible for a visit.   Specialty: Internal Medicine Why: Call for an appointment tomorrow for Friday November 29, 2023.   If you cannot get an appointment by friday, call the internal medicine residency clinic to be seen [phone]). Contact information: [address] [place] Tammy Carter [facility] [address] [phone]                 Hospital Course by problem list: Tammy Carter is a 63 y.o. 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 Prior to fall and LOC, she had significantly worse abdominal pain and took her blood pressure medications. Syncopal episode likely caused vasovagal response in conjunction with decreased blood pressure. Echo  unremarkable Hx: HTN Switch home lisinopril  5 to lisinopril  10 mg. Holding amlodipine   Abdominal Pain  Patient presented to ED with LUQ abdominal pain going on for 3 months but increasing in severity the night of admission to hospital. CT abdomen/pelvis demonstrating long segment wall thickening and mucosal hyper enhancement of the descending colon, sigmoid colon, and rectum consistent with nonspecific infectious, inflammatory, or ischemic colitis and atherosclerosis of aorta. Last colonoscopy 2007 demonstrated polyps. Last cologaurd 2024 was negative. CTA Abdomen suggests normal contour and caliber of the abdominal aorta and branch vessels are widely patent. No current diarrheal stools or abdominal pain. Likely cause of abdominal pain and bloody diarrhea now is diverticulitis. Ceftriaxone  (2 g 100 ml started 7/6) and metronidazole  (500 mg started 7/6)   Bradycardia Patient presented to the ED with bradycardia and has maintained bradycardia around 50-60s. stable and asymptomatic.  She was tested for chronotropic incompetence but was found to have an appropriate HR response to exercise.      Abnormal CT of the Head c/f demyelination  Patients MRI and CT of head suggest microvascular changes > demyelinating disease. starting Crestor  now, will continue outpatient   HIV ab Reactive  HIV Ab resulted positive. Further labs are pending. will provide contact information for f/u if d/c today and labs return positive     Stable chronic medical conditions: HTN  Discharge Exam:   BP (!) 154/70 (BP Location: Left Arm)   Pulse (!) 56   Temp (!) 97.5 F (36.4 C)   Resp 18   Ht 5' 5 (1.651 m)   Wt 79.4 kg   SpO2 100%   BMI 29.12 kg/m  Discharge exam: Physical Exam Constitutional:      Appearance: She is well-developed.  Cardiovascular:     Rate and Rhythm: Normal rate and regular rhythm.     Heart sounds: Murmur (systolic 2nd right intercostal space) heard.  Pulmonary:     Effort: Pulmonary  effort is normal.     Breath sounds: Normal breath sounds.  Abdominal:     General: Bowel sounds are normal.     Palpations: Abdomen is soft.     Tenderness: There is no abdominal tenderness.  Skin:    General: Skin is warm and dry.  Neurological:     Mental Status: She is alert.      Pertinent Labs, Studies, and Procedures:     Latest Ref Rng & Units 11/26/2023    4:34 AM 11/25/2023    4:33 AM 11/24/2023    3:22 AM  CBC  WBC 4.0 - 10.5 K/uL 6.3  7.6  8.1   Hemoglobin 12.0 - 15.0 g/dL 87.0  85.8  85.9   Hematocrit 36.0 - 46.0 % 38.3  42.4  40.8   Platelets 150 - 400 K/uL 189  167  201        Latest Ref Rng & Units 11/26/2023    4:34 AM 11/25/2023    4:33  AM 11/24/2023    3:22 AM  CMP  Glucose 70 - 99 mg/dL 88  99  899   BUN 8 - 23 mg/dL 5  <5  <5   Creatinine 0.44 - 1.00 mg/dL 9.46  9.36  9.33   Sodium 135 - 145 mmol/L 139  141  137   Potassium 3.5 - 5.1 mmol/L 4.0  4.6  3.7   Chloride 98 - 111 mmol/L 106  106  107   CO2 22 - 32 mmol/L 24  27  24    Calcium  8.9 - 10.3 mg/dL 8.9  9.3  8.7   Total Protein 6.5 - 8.1 g/dL   6.2   Total Bilirubin 0.0 - 1.2 mg/dL   0.9   Alkaline Phos 38 - 126 U/L   64   AST 15 - 41 U/L   22   ALT 0 - 44 U/L   16     CT Angio Abd/Pel w/ and/or w/o Result Date: 11/24/2023 CLINICAL DATA:  Mesenteric ischemia, chronic, with colitis EXAM: CTA ABDOMEN AND PELVIS WITHOUT AND WITH CONTRAST TECHNIQUE: Multidetector CT imaging of the abdomen and pelvis was performed using the standard protocol during bolus administration of intravenous contrast. Multiplanar reconstructed images and MIPs were obtained and reviewed to evaluate the vascular anatomy. RADIATION DOSE REDUCTION: This exam was performed according to the departmental dose-optimization program which includes automated exposure control, adjustment of the mA and/or kV according to patient size and/or use of iterative reconstruction technique. CONTRAST:  OMNIPAQUE  IOHEXOL  350 MG/ML SOLN COMPARISON:   11/23/2023 FINDINGS: VASCULAR Normal contour and caliber of the abdominal aorta. Minimal, scattered aortoiliac atherosclerosis. No evidence of aneurysm, dissection, or other acute aortic pathology. Standard branching pattern of the abdominal aorta with solitary bilateral renal arteries. The branch vessel origins are widely patent, with specific attention to the mesenteric arteries. Review of the MIP images confirms the above findings. NON-VASCULAR Lower Chest: No acute findings. Hepatobiliary: No solid liver abnormality is seen. No gallstones, gallbladder wall thickening, or biliary dilatation. Pancreas: Unremarkable. No pancreatic ductal dilatation or surrounding inflammatory changes. Spleen: Normal in size without significant abnormality. Adrenals/Urinary Tract: Adrenal glands are unremarkable. Kidneys are normal, without renal calculi, solid lesion, or hydronephrosis. Bladder is unremarkable. Stomach/Bowel: Stomach is within normal limits. Appendix not clearly visualized. Long segment wall thickening of the descending colon, sigmoid colon, and rectum, generally similar in appearance to prior examination with increased adjacent fat stranding (series 8, image 141, 176). Lymphatic: No enlarged abdominal or pelvic lymph nodes. Reproductive: No mass or other significant abnormality. Other: No abdominal wall hernia or abnormality. No ascites. Musculoskeletal: No acute osseous findings. IMPRESSION: 1. Normal contour and caliber of the abdominal aorta. Minimal, scattered aortoiliac atherosclerosis. No evidence of aneurysm, dissection, or other acute aortic pathology. The branch vessel origins are widely patent, with specific attention to the mesenteric arteries. 2. Long segment wall thickening of the descending colon, sigmoid colon, and rectum, generally similar in appearance to prior examination with increased adjacent fat stranding. Electronically Signed   By: Marolyn JONETTA Jaksch M.D.   On: 11/24/2023 12:12   MR BRAIN W WO  CONTRAST Result Date: 11/23/2023 CLINICAL DATA:  Initial evaluation for demyelinating disease. EXAM: MRI HEAD WITHOUT AND WITH CONTRAST TECHNIQUE: Multiplanar, multiecho pulse sequences of the brain and surrounding structures were obtained without and with intravenous contrast. CONTRAST:  7.5mL GADAVIST  GADOBUTROL  1 MMOL/ML IV SOLN COMPARISON:  CT from 11/23/2023. FINDINGS: Brain: Cerebral volume within normal limits. Patchy and confluent T2/FLAIR hyperintensity seen  involving the periventricular, deep, and subcortical white matter both cerebral hemispheres. Mild patchy involvement of the cerebellum. Although nonspecific, overall appearance and distribution is favored to reflect sequelae of chronic microvascular ischemic disease. Although a demyelinating process is not entirely excluded, this is felt to be less likely. No evidence for acute or subacute infarct. No areas of chronic cortical infarction. No acute or chronic intracranial blood products. No mass lesion, midline shift or mass effect. No hydrocephalus or extra-axial fluid collection. Pituitary gland within normal limits. No abnormal enhancement. Vascular: Major intracranial vascular flow voids are maintained. Skull and upper cervical spine: Craniocervical junction within normal limits. Bone marrow signal intensity normal. No scalp soft tissue abnormality. Sinuses/Orbits: Globes orbital soft tissues within normal limits. Paranasal sinuses are largely clear. No mastoid effusion. Other: None. IMPRESSION: 1. Patchy and confluent T2/FLAIR hyperintensity involving the supratentorial cerebral white matter, with mild patchy involvement of the cerebellum. Although nonspecific, overall appearance and distribution is favored to reflect sequelae of chronic microvascular ischemic disease. Although a demyelinating process is not entirely excluded, this is felt to be less likely. No abnormal enhancement. 2. No other acute intracranial abnormality. Electronically Signed    By: Morene Hoard M.D.   On: 11/23/2023 18:47   ECHOCARDIOGRAM COMPLETE Result Date: 11/23/2023    ECHOCARDIOGRAM REPORT   Patient Name:   Tammy Carter Date of Exam: 11/23/2023 Medical Rec #:  [id number]        Height:       65.0 in Accession #:    [id number]       Weight:       175.0 lb Date of Birth:  03-Mar-1961        BSA:          1.869 m Patient Age:    63 years         BP:           152/68 mmHg Patient Gender: F                HR:           70 bpm. Exam Location:  Inpatient Procedure: 2D Echo, 3D Echo, Color Doppler, Cardiac Doppler and Strain Analysis            (Both Spectral and Color Flow Doppler were utilized during            procedure). Indications:    Syncope  History:        Patient has prior history of Echocardiogram examinations, most                 recent 04/16/2018. CAD, Arrythmias:SVT; Risk Factors:Former                 Smoker, Dyslipidemia, Hypertension and Hypothyroidism.  Sonographer:    Logan Shove RDCS Referring Phys: [id number] ELSIE KATHEE SAVANNAH IMPRESSIONS  1. Left ventricular ejection fraction, by estimation, is 65 to 70%. Left ventricular ejection fraction by 3D volume is 69 %. The left ventricle has normal function. The left ventricle has no regional wall motion abnormalities. Left ventricular diastolic  parameters were normal. The average left ventricular global longitudinal strain is -23.8 %. The global longitudinal strain is normal.  2. Right ventricular systolic function is normal. The right ventricular size is normal. There is normal pulmonary artery systolic pressure. The estimated right ventricular systolic pressure is 20.3 mmHg.  3. The mitral valve is normal in structure. Trivial mitral valve regurgitation.  4. The aortic valve is tricuspid. Aortic  valve regurgitation is trivial. No aortic stenosis is present.  5. The inferior vena cava is normal in size with greater than 50% respiratory variability, suggesting right atrial pressure of 3 mmHg. FINDINGS  Left Ventricle:  Left ventricular ejection fraction, by estimation, is 65 to 70%. Left ventricular ejection fraction by 3D volume is 69 %. The left ventricle has normal function. The left ventricle has no regional wall motion abnormalities. The average left ventricular global longitudinal strain is -23.8 %. Strain was performed and the global longitudinal strain is normal. The left ventricular internal cavity size was normal in size. There is no left ventricular hypertrophy. Left ventricular diastolic parameters were normal. Right Ventricle: The right ventricular size is normal. No increase in right ventricular wall thickness. Right ventricular systolic function is normal. There is normal pulmonary artery systolic pressure. The tricuspid regurgitant velocity is 2.08 m/s, and  with an assumed right atrial pressure of 3 mmHg, the estimated right ventricular systolic pressure is 20.3 mmHg. Left Atrium: Left atrial size was normal in size. Right Atrium: Right atrial size was normal in size. Pericardium: There is no evidence of pericardial effusion. Mitral Valve: The mitral valve is normal in structure. Trivial mitral valve regurgitation. Tricuspid Valve: The tricuspid valve is normal in structure. Tricuspid valve regurgitation is trivial. Aortic Valve: The aortic valve is tricuspid. Aortic valve regurgitation is trivial. No aortic stenosis is present. Aortic valve mean gradient measures 9.0 mmHg. Aortic valve peak gradient measures 17.6 mmHg. Aortic valve area, by VTI measures 2.52 cm. Pulmonic Valve: The pulmonic valve was not well visualized. Pulmonic valve regurgitation is trivial. Aorta: The aortic root and ascending aorta are structurally normal, with no evidence of dilitation. Venous: The inferior vena cava is normal in size with greater than 50% respiratory variability, suggesting right atrial pressure of 3 mmHg. IAS/Shunts: The atrial septum is grossly normal. Additional Comments: 3D was performed not requiring image post  processing on an independent workstation and was normal.  LEFT VENTRICLE PLAX 2D LVIDd:         4.40 cm         Diastology LVIDs:         2.50 cm         LV e' medial:    7.09 cm/s LV PW:         0.90 cm         LV E/e' medial:  12.8 LV IVS:        0.80 cm         LV e' lateral:   11.70 cm/s LVOT diam:     1.90 cm         LV E/e' lateral: 7.8 LV SV:         106 LV SV Index:   57              2D Longitudinal LVOT Area:     2.84 cm        Strain                                2D Strain GLS   -23.8 %                                Avg:  3D Volume EF                                LV 3D EF:    Left                                             ventricul                                             ar                                             ejection                                             fraction                                             by 3D                                             volume is                                             69 %.                                 3D Volume EF:                                3D EF:        69 %                                LV EDV:       113 ml                                LV ESV:       35 ml                                LV SV:        78 ml RIGHT VENTRICLE             IVC RV Basal diam:  3.10 cm     IVC diam: 1.30 cm RV S prime:     17.00 cm/s TAPSE (M-mode):  3.1 cm LEFT ATRIUM             Index        RIGHT ATRIUM           Index LA diam:        3.20 cm 1.71 cm/m   RA Area:     15.70 cm LA Vol (A2C):   64.8 ml 34.67 ml/m  RA Volume:   38.80 ml  20.76 ml/m LA Vol (A4C):   31.3 ml 16.75 ml/m LA Biplane Vol: 45.5 ml 24.34 ml/m  AORTIC VALVE AV Area (Vmax):    2.15 cm AV Area (Vmean):   1.92 cm AV Area (VTI):     2.52 cm AV Vmax:           210.00 cm/s AV Vmean:          134.000 cm/s AV VTI:            0.421 m AV Peak Grad:      17.6 mmHg AV Mean Grad:      9.0 mmHg LVOT Vmax:         159.00 cm/s LVOT Vmean:        90.700 cm/s  LVOT VTI:          0.374 m LVOT/AV VTI ratio: 0.89  AORTA Ao Root diam: 3.10 cm Ao Asc diam:  3.60 cm MITRAL VALVE                TRICUSPID VALVE MV Area (PHT): 3.12 cm     TR Peak grad:   17.3 mmHg MV Decel Time: 243 msec     TR Vmax:        208.00 cm/s MV E velocity: 91.10 cm/s MV A velocity: 107.00 cm/s  SHUNTS MV E/A ratio:  0.85         Systemic VTI:  0.37 m                             Systemic Diam: 1.90 cm Lonni Nanas MD Electronically signed by Lonni Nanas MD Signature Date/Time: 11/23/2023/3:18:13 PM    Final    CT HEAD WO CONTRAST ( ) Result Date: 11/23/2023 CLINICAL DATA:  Head trauma, syncope EXAM: CT HEAD WITHOUT CONTRAST TECHNIQUE: Contiguous axial images were obtained from the base of the skull through the vertex without intravenous contrast. RADIATION DOSE REDUCTION: This exam was performed according to the departmental dose-optimization program which includes automated exposure control, adjustment of the mA and/or kV according to patient size and/or use of iterative reconstruction technique. COMPARISON:  07/18/2016 FINDINGS: Brain: No evidence of acute infarction, hemorrhage, hydrocephalus, extra-axial collection or mass lesion/mass effect. Patchy deep and juxtacortical white matter hypodensity, similar to prior examination (series 4, image 18). Vascular: No hyperdense vessel or unexpected calcification. Skull: Normal. Negative for fracture or focal lesion. Sinuses/Orbits: No acute finding. Other: None. IMPRESSION: 1. No acute intracranial pathology. 2. Patchy deep and juxtacortical white matter hypodensity, similar to prior examination. This appearance suggests demyelinating disorder such as multiple sclerosis although could be related to hypertensive small-vessel white matter disease. Electronically Signed   By: Marolyn JONETTA Jaksch M.D.   On: 11/23/2023 06:57   CT ABDOMEN PELVIS W CONTRAST Result Date: 11/23/2023 CLINICAL DATA:  Abdominal pain EXAM: CT ABDOMEN AND PELVIS WITH  CONTRAST TECHNIQUE: Multidetector CT imaging of the abdomen and pelvis was performed using the standard protocol following bolus administration of intravenous contrast. RADIATION DOSE REDUCTION: This exam  was performed according to the departmental dose-optimization program which includes automated exposure control, adjustment of the mA and/or kV according to patient size and/or use of iterative reconstruction technique. CONTRAST:  75mL OMNIPAQUE  IOHEXOL  350 MG/ML SOLN COMPARISON:  None Available. FINDINGS: Lower chest: No acute abnormality. Hepatobiliary: No solid liver abnormality is seen. No gallstones, gallbladder wall thickening, or biliary dilatation. Pancreas: Unremarkable. No pancreatic ductal dilatation or surrounding inflammatory changes. Spleen: Normal in size without significant abnormality. Adrenals/Urinary Tract: Adrenal glands are unremarkable. Kidneys are normal, without renal calculi, solid lesion, or hydronephrosis. Bladder is unremarkable. Stomach/Bowel: Stomach is distended by ingested material. Appendix appears normal. Long segment wall thickening and mucosal hyperenhancement of the descending colon, sigmoid colon, and rectum (series 3, image 72). Vascular/Lymphatic: Scattered aortic atherosclerosis. No enlarged abdominal or pelvic lymph nodes. Reproductive: No mass or other significant abnormality. Other: No abdominal wall hernia or abnormality. No ascites. Musculoskeletal: No acute or significant osseous findings. IMPRESSION: 1. Long segment wall thickening and mucosal hyperenhancement of the descending colon, sigmoid colon, and rectum, consistent with nonspecific infectious, inflammatory, or ischemic colitis. 2. Stomach is distended by ingested material; possibly related to a large meal however correlate for gastroparesis or gastric outlet obstruction. Aortic Atherosclerosis (ICD10-I70.0). Electronically Signed   By: Marolyn JONETTA Jaksch M.D.   On: 11/23/2023 06:55     Discharge  Instructions: Discharge Instructions     Call MD for:  difficulty breathing, headache or visual disturbances   Complete by: As directed    Call MD for:  extreme fatigue   Complete by: As directed    Call MD for:  hives   Complete by: As directed    Call MD for:  persistant dizziness or light-headedness   Complete by: As directed    Call MD for:  persistant nausea and vomiting   Complete by: As directed    Call MD for:  severe uncontrolled pain   Complete by: As directed    Call MD for:  temperature >100.4   Complete by: As directed    Diet - low sodium heart healthy   Complete by: As directed    Discharge instructions   Complete by: As directed    Thank you for allowing us  to be part of your care. You were hospitalized for 3 days for abdominal pain and workup for passing out. We treated you with antibiotics.   See the changes in your medications and management of your chronic conditions below:  *For your Syncope  -We have CHANGED your Lisinopril  5 mg daily to Lisinopril  10 mg daily -We have STOPPED your Amlodipine 2.5 mg  Your syncope (passing out) was likely the result of severe abdominal pain causing a vaso-vagal response (sudden drop in heart rate and blood pressure). You had also just taken your blood pressure medication prior to passing out. For this reason, we have taken you off of your Amlodipine and just increased your Lisinopril .    *For your Abdominal Pain / Diverticulitis  -We have STARTED you on these following medications:  - Antibiotic: Augmentin  (Amoxicillin -clavulanate) 875 mg every 12 hours for 7 days.             -Tylenol  as needed for pain (every 6 hours)            - Pantoprazole  40 mg daily (helps with acid reflux/burping)  [facility] or Dr. Shona on Friday November 29, 2023.  You will also need a colonoscopy 6 weeks after discharge - this  will be arranged at your clinic follow up.  Please make sure to discuss with the  primary care doctor if you have had any more bloody stool, diarrhea, or abdominal pain. If you pass out again or have a severe diarrhea, abdominal pain, or large amount of blood in stool, please go to the ED.    Please discuss arranging for a colonoscopy 6 weeks after your discharge.   HIV confirmatory labs are still pending. When those labs come back, you will receive a phone call from one of the Internal Medicine Residency Doctors that have seen you during this hospital stay. We will discuss your options and refer you to an Infectious Disease specialist.   Our clinic and after hours phone number is [phone]. The best time to call is Monday through Friday 9 am to 4 pm but there is always someone available 24/7.  We are glad you are feeling better,  Sallyanne Primas, DO Internal Medicine Inpatient Teaching Service at [facility]   Increase activity slowly   Complete by: As directed        Signed: Primas Sallyanne, DO 11/26/2023, 6:52 PM

## 2023-11-26 NOTE — Progress Notes (Signed)
 Transition of Care [facility]) - Inpatient Brief Assessment   Patient Details  Name: Tammy Carter MRN: [id number] Date of Birth: 27-Jul-1960  Transition of Care [facility]) CM/SW Contact:    Rosaline JONELLE Joe, RN Phone Number: 11/26/2023, 4:36 PM   Clinical Narrative: Patient admitted from home with Syncope.  No TOC needs at this time.   Transition of Care Asessment: Insurance and Status: (P) Insurance coverage has been reviewed Patient has primary care physician: (P) Yes Home environment has been reviewed: (P) from home with family Prior level of function:: (P) Independent Prior/Current Home Services: (P) No current home services Social Drivers of Health Review: (P) SDOH reviewed no interventions necessary Readmission risk has been reviewed: (P) Yes Transition of care needs: (P) no transition of care needs at this time

## 2023-11-26 NOTE — Plan of Care (Signed)

## 2023-11-26 NOTE — Progress Notes (Signed)
 HD#0 SUBJECTIVE:  Patient Summary: Tammy Carter is a 63 y.o. (spanish primary language) with a pertinent PMH of HTN and GERD, who presented with syncope and abdominal pain and admitted for syncope and abdominal pain. workup with CT abdomen/pelvis demonstrating long segment wall thickening and mucosal hyper enhancement of the descending colon, sigmoid colon, and rectum consistent with nonspecific infectious, inflammatory, or ischemic colitis. CTA Abdomen suggests normal contour and caliber of the abdominal aorta and branch vessels are widely patent. CT and MR brain suggest microvascular changes with no acute intracranial pathology.   Overnight Events and Interim History: No overnight events. GI panel was negative. Patient's daughter reports 1 bowel movement with 3 small stools and 2 tablespoons of bright red blood. Patient denies LLQ pain, periumbilical pain, or burping this AM.    OBJECTIVE:  Vital Signs: Vitals:   11/25/23 2058 11/25/23 2213 11/26/23 0545 11/26/23 0814  BP: (!) 159/78 136/71 127/78 (!) 154/70  Pulse: (!) 55 (!) 58 (!) 53 (!) 56  Resp: 18  18 18   Temp: 98.4 F (36.9 C)  98.3 F (36.8 C) (!) 97.5 F (36.4 C)  TempSrc:      SpO2: 99%  93% 100%  Weight:      Height:       Supplemental O2: Room Air SpO2: 100 %  Filed Weights   11/23/23 0921  Weight: 79.4 kg    No intake or output data in the 24 hours ending 11/26/23 1210  Net IO Since Admission: 1,399.53 mL [11/26/23 1210]  Physical Exam: Physical Exam Constitutional:      Appearance: She is well-developed.  Cardiovascular:     Rate and Rhythm: Normal rate and regular rhythm.     Heart sounds: Murmur (systolic murmur loudest over right 2nd ICS) heard.  Pulmonary:     Effort: Pulmonary effort is normal.     Breath sounds: Normal breath sounds.  Abdominal:     General: Bowel sounds are normal.     Palpations: Abdomen is soft.  Skin:    General: Skin is warm and dry.  Neurological:     Mental  Status: She is alert.    Patient Lines/Drains/Airways Status     Active Line/Drains/Airways     Name Placement date Placement time Site Days   Peripheral IV 11/23/23 20 G Anterior;Proximal;Right Forearm 11/23/23  0600  Forearm  1            Pertinent labs and imaging:  HIV Ab reactive  GI panel negative      Latest Ref Rng & Units 11/26/2023    4:34 AM 11/25/2023    4:33 AM 11/24/2023    3:22 AM  CBC  WBC 4.0 - 10.5 K/uL 6.3  7.6  8.1   Hemoglobin 12.0 - 15.0 g/dL 87.0  85.8  85.9   Hematocrit 36.0 - 46.0 % 38.3  42.4  40.8   Platelets 150 - 400 K/uL 189  167  201        Latest Ref Rng & Units 11/26/2023    4:34 AM 11/25/2023    4:33 AM 11/24/2023    3:22 AM  CMP  Glucose 70 - 99 mg/dL 88  99  899   BUN 8 - 23 mg/dL 5  <5  <5   Creatinine 0.44 - 1.00 mg/dL 9.46  9.36  9.33   Sodium 135 - 145 mmol/L 139  141  137   Potassium 3.5 - 5.1 mmol/L 4.0  4.6  3.7  Chloride 98 - 111 mmol/L 106  106  107   CO2 22 - 32 mmol/L 24  27  24    Calcium  8.9 - 10.3 mg/dL 8.9  9.3  8.7   Total Protein 6.5 - 8.1 g/dL   6.2   Total Bilirubin 0.0 - 1.2 mg/dL   0.9   Alkaline Phos 38 - 126 U/L   64   AST 15 - 41 U/L   22   ALT 0 - 44 U/L   16     No results found.   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 Prior to fall and LOC, she had significantly worse abdominal pain and took her blood pressure medications. Syncopal episode likely caused vasovagal response in conjunction with decreased blood pressure -Echo unremarkable   #Abdominal Pain  Patient presented to ED with LUQ abdominal pain going on for 3 months but increasing in severity the night of admission to hospital. CT abdomen/pelvis demonstrating long segment wall thickening and mucosal hyper enhancement of the descending  colon, sigmoid colon, and rectum consistent with nonspecific infectious, inflammatory, or ischemic colitis and atherosclerosis of aorta. Last colonoscopy 2007 demonstrated polyps. Last cologaurd 2024 was negative. CTA Abdomen suggests normal contour and caliber of the abdominal aorta and branch vessels are widely patent. No current diarrheal stools or abdominal pain. Likely cause of abdominal pain and bloody diarrhea now is diverticulitis. Blood described as dark red, not black -ceftriaxone  (2 g 100 ml started 7/6) and metronidazole  (500 mg started 7/6) -advanced diet to heart healthy, will d/c if no further diarrhea/blood -periumbilical pain and burping resolved with pantoprazole  40mg  -F/u outpatient GI for colonoscopy 6 weeks after d/c  -d/c ASA, on SCDs -low suspicion of infectious colitis (no leukocytosis, lactic acid 1.8, CBC and CMP unremarkable.)  #Bradycardia Patient presented to the ED with bradycardia and has maintained bradycardia around 50-60s. -stable and asymptomatic -Seated EOB HR: 67-69;ambulation HR: 70-72. Increased by 5 bpm which is WNL for seated to standing.  -Orthostatic vitals negative  -do not recommend beta blocker for this patient in the future.  -TSH WNL -Loop recorder outpatient f/u with PCP due to syncopal episode   #HTN Patient has history of HTN. Was taking home medications without hospital knowledge. Last dose was this AM of home meds lisinopril  5 mg and amlodipine 2.5 mg. Patient has had slight increased of BP even on home meds (155/70) -pausing home meds  -starting lisinopril  10 mg starting tomorrow. 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 Patient's daughter mentioned that patient has only had one sexual partner (her husband), but that the patient's husband may or may not have cheated several years prior. Further labs are pending. We mentioned that ID will be following up if positive so she can receive treatment.  -pending further labs  -will provide contact information for f/u if d/c today and labs return positive  Best Practice: Diet: heart healthy diet  VTE: Place and maintain sequential compression device Start: 11/25/23 0953 SCDs Start: 11/23/23 9166 Code: Full  Disposition planning: Therapy Recs: None, DME: none Family Contact: Daughters, at bedside. DISPO: Receiving IV antibiotics for diverticulitis. Pending further HIV workup. Will d/c today if tolerating new diet   Signature:  Sallyanne Primas, DO [facility]  12:10 PM, 11/26/2023  On Call pager [phone]

## 2023-11-27 LAB — RPR: RPR Ser Ql: NONREACTIVE

## 2023-11-27 LAB — HIV-1/HIV-2 QUALITATIVE RNA
Final Interpretation: NEGATIVE
HIV-1 RNA, Qualitative: NONREACTIVE
HIV-2 RNA, Qualitative: NONREACTIVE

## 2023-11-27 LAB — HIV-1/2 AB - DIFFERENTIATION
HIV 1 Ab: NONREACTIVE
HIV 2 Ab: NONREACTIVE
Note: NEGATIVE

## 2023-11-27 LAB — OVA + PARASITE EXAM

## 2023-11-27 LAB — O&P RESULT

## 2023-11-28 ENCOUNTER — Other Ambulatory Visit (HOSPITAL_COMMUNITY): Payer: Self-pay

## 2023-11-28 DIAGNOSIS — I10 Essential (primary) hypertension: Secondary | ICD-10-CM

## 2023-12-02 DIAGNOSIS — E559 Vitamin D deficiency, unspecified: Secondary | ICD-10-CM | POA: Diagnosis not present

## 2023-12-02 DIAGNOSIS — Z131 Encounter for screening for diabetes mellitus: Secondary | ICD-10-CM | POA: Diagnosis not present

## 2023-12-02 DIAGNOSIS — I10 Essential (primary) hypertension: Secondary | ICD-10-CM | POA: Diagnosis not present

## 2023-12-02 DIAGNOSIS — R21 Rash and other nonspecific skin eruption: Secondary | ICD-10-CM | POA: Diagnosis not present

## 2023-12-03 ENCOUNTER — Encounter (INDEPENDENT_AMBULATORY_CARE_PROVIDER_SITE_OTHER): Payer: Self-pay | Admitting: *Deleted

## 2023-12-10 ENCOUNTER — Other Ambulatory Visit (HOSPITAL_COMMUNITY): Payer: Self-pay

## 2023-12-10 DIAGNOSIS — E559 Vitamin D deficiency, unspecified: Secondary | ICD-10-CM

## 2023-12-10 DIAGNOSIS — Z1231 Encounter for screening mammogram for malignant neoplasm of breast: Secondary | ICD-10-CM

## 2023-12-18 ENCOUNTER — Ambulatory Visit (HOSPITAL_COMMUNITY)
Admission: RE | Admit: 2023-12-18 | Discharge: 2023-12-18 | Disposition: A | Discharge status: Home / Self Care | Source: Ambulatory Visit

## 2023-12-18 DIAGNOSIS — Z780 Asymptomatic menopausal state: Secondary | ICD-10-CM | POA: Diagnosis not present

## 2023-12-18 DIAGNOSIS — Z1231 Encounter for screening mammogram for malignant neoplasm of breast: Secondary | ICD-10-CM | POA: Insufficient documentation

## 2023-12-18 DIAGNOSIS — E559 Vitamin D deficiency, unspecified: Secondary | ICD-10-CM | POA: Insufficient documentation

## 2023-12-19 ENCOUNTER — Encounter (INDEPENDENT_AMBULATORY_CARE_PROVIDER_SITE_OTHER): Payer: Self-pay | Admitting: Gastroenterology

## 2023-12-19 ENCOUNTER — Other Ambulatory Visit: Payer: Self-pay | Admitting: *Deleted

## 2023-12-19 ENCOUNTER — Ambulatory Visit (INDEPENDENT_AMBULATORY_CARE_PROVIDER_SITE_OTHER): Admitting: Gastroenterology

## 2023-12-19 ENCOUNTER — Encounter: Payer: Self-pay | Admitting: *Deleted

## 2023-12-19 ENCOUNTER — Other Ambulatory Visit: Payer: Self-pay | Admitting: Student

## 2023-12-19 VITALS — BP 155/81 | HR 47 | Temp 97.8°F | Ht 65.0 in | Wt 169.7 lb

## 2023-12-19 DIAGNOSIS — K529 Noninfective gastroenteritis and colitis, unspecified: Secondary | ICD-10-CM

## 2023-12-19 DIAGNOSIS — K559 Vascular disorder of intestine, unspecified: Secondary | ICD-10-CM | POA: Insufficient documentation

## 2023-12-19 DIAGNOSIS — K5904 Chronic idiopathic constipation: Secondary | ICD-10-CM

## 2023-12-19 MED ORDER — DICYCLOMINE HCL 10 MG PO CAPS: 10.0000 mg | ORAL_CAPSULE | Freq: Two times a day (BID) | ORAL | 1 refills | Status: DC | PRN

## 2023-12-19 MED ORDER — PEG 3350-KCL-NA BICARB-NACL 420 G PO SOLR: 4000.0000 mL | Freq: Once | ORAL | 0 refills | Status: AC

## 2023-12-19 NOTE — Patient Instructions (Addendum)
 Continue MiraLAX to keep stools soft Schedule colonoscopy after the third week of August Follow closely with PCP to maintain an adequate heart rate and blood pressure Start Bentyl  1 tablet q12h as needed for abdominal pain

## 2023-12-19 NOTE — Progress Notes (Signed)
 Toribio Fortune, M.D. Gastroenterology & Hepatology [facility] Gastroenterology [address] [place], [place] [address] Primary Care Physician: Shona Norleen PEDLAR, MD [address] [place] [address]  Referring MD: PCP  Chief Complaint: Colitis  History of Present Illness: ADALYNA GODBEE is a 63 y.o. female with PMH HTN, who presents for evaluation of recent episode of colitis.  Patient was admitted to the hospital on 11/23/2023 after presenting severe acute abdominal pain in her mid abdomen. The patient denied having any nausea, vomiting, fever, chills, melena, hematemesis, abdominal distention, abdominal pain, diarrhea, jaundice, pruritus or weight loss. Labs showed normal CBC, CMP with sodium 134, otherwise was within normal limits.  FOBT was positive.  GI pathogen panel and ova and parasite were negative.  CT of the abdomen pelvis with IV contrast showed long segment wall thickening and hyperenhancement of the ascending, sigmoid, and rectum.  CT angio of the abdomen showed normal patency of the vasculature of the abdomen.  Patient was given an empiric course with ceftriaxone  and metronidazole  with resolution of symptoms. She was discharged on oral antibiotics. Of note, she had a syncopal episode due to hypotension and bradycardia, which was the attributed cause for ischemic colitis.  Notably, when she was hospitalized, she noticed a change in her stools as she had bloody diarrhea in multiple episodes.  The patient reports that her symptoms have improved, but she is still presenting abdominal pain in her left flank, migrating to the right side of there abdomen. She is currently having 3 Bms per day as she is taking Miralax (was having some slight constipation after she left the hospital). She denies having any nausea, vomiting, fever, chills, melena, hematemesis, abdominal distention, abdominal pain, jaundice, pruritus. She has lost weight on purpose by changing  her diet - has lost 90 lb in the last 4 years.  Does not take NSAIDs or anticoagulants.  Last ZHI:fjwb years ago, does remember when - no reports available Last Colonoscopy: Possibly in 2007, had polyps at that time.  Had a Cologuard in 2024 that was negative.  FHx: father had gastric ulcers, neg for any gastrointestinal/liver disease, no malignancies Social: neg smoking, alcohol or illicit drug use Surgical: no abdominal surgeries  Past Medical History: Past Medical History:  Diagnosis Date   Abnormal levels of other serum enzymes    ADD (attention deficit disorder)    Allergic contact dermatitis due to plants, except food    Body mass index 37.0-37.9, adult    Body mass index 38.0-38.9, adult    CAD (coronary artery disease)    Depression, major    Dyspepsia 02/23/2013   Dysphagia, unspecified(787.20) 02/23/2013   Dysuria    Encounter for general adult medical examination with abnormal findings    Essential (primary) hypertension    GERD (gastroesophageal reflux disease)    HSV infection    Hx of migraines    Hyperlipidemia    Hypertension    Hypothyroidism    Localized edema    Other fatigue    Other malaise    Other seasonal allergic rhinitis    Other specified noninflammatory disorders of vagina    Pain in throat    Palpitations    SVT (supraventricular tachycardia) (HCC)    Unspecified constipation 02/23/2013   Unspecified urinary incontinence     Past Surgical History: Past Surgical History:  Procedure Laterality Date   APPENDECTOMY     COLONOSCOPY   09/26/2005   WLM:Wnmfjo colonoscopy   ESOPHAGOGASTRODUODENOSCOPY  09/26/2005   NUR: Small gastric polyps at body otherwise normal esophagogastroduodenoscopy. Three of these polyps were biopsied for  histology. Endoscopically these appeared to be hyperplastic/ Esophagus dilated by passing 56 French Maloney dilator, empiric. benign path   PALATE SURGERY     EXPANSION    Family History: Family History  Problem  Relation Age of Onset   Heart disease Mother    Hypertension Mother    Diabetes type II Mother    Heart attack Mother 67   CAD Mother    Aneurysm Father        CEREBRAL   Hypertension Father    AAA (abdominal aortic aneurysm) Father    Valvular heart disease Father    CAD Father    Hypertension Sister    High Cholesterol Sister    Heart Problems Sister        PVC   Muscular dystrophy Brother    Heart disease Maternal Grandmother    Cancer Paternal Grandmother 49       BREAST AND COLON   Healthy Daughter    Healthy Daughter    Colon cancer Neg Hx     Social History: Social History   Tobacco Use  Smoking Status Former   Current packs/day: 0.00   Types: Cigarettes   Quit date: 04/05/1979   Years since quitting: 44.7  Smokeless Tobacco Never  Tobacco Comments   SOCIAL   Social History   Substance and Sexual Activity  Alcohol Use Yes   Alcohol/week: 1.0 - 2.0 standard drink of alcohol   Types: 1 - 2 Glasses of wine per week   Social History   Substance and Sexual Activity  Drug Use No    Allergies: Allergies  Allergen Reactions   Atorvastatin Other (See Comments)    -myalgia   Ferrous Sulfate Palpitations    Medications: Current Outpatient Medications  Medication Sig Dispense Refill   acetaminophen  (TYLENOL ) 325 MG tablet Take 2 tablets (650 mg total) by mouth every 6 (six) hours as needed for mild pain (pain score 1-3) or fever (or Fever >/= 101). 30 tablet 0   lisinopril  (ZESTRIL ) 10 MG tablet TAKE 1 TABLET BY MOUTH EVERY DAY 30 tablet 0   pantoprazole  (PROTONIX ) 40 MG tablet TAKE 1 TABLET BY MOUTH EVERY DAY 30 tablet 0   rosuvastatin  (CRESTOR ) 20 MG tablet TAKE 1 TABLET BY MOUTH EVERYDAY AT BEDTIME 30 tablet 0   [Paused] amLODipine (NORVASC) 2.5 MG tablet Take 2.5 mg by mouth daily. (Patient not taking: Reported on 12/19/2023)     Calcium -Magnesium-Vitamin D (CALCIUM  500 PO) Take 1 tablet by mouth daily. (Patient not taking: Reported on 12/19/2023)      [Paused] ferrous sulfate 325 (65 FE) MG tablet Take 325 mg by mouth daily with breakfast. (Patient not taking: Reported on 12/19/2023)     Multiple Vitamins-Minerals (VITAMIN D3 COMPLETE PO) Take 1 tablet by mouth daily. (Patient not taking: Reported on 12/19/2023)     Multiple Vitamins-Minerals (WOMENS MULTIVITAMIN PO) Take by mouth daily. (Patient not taking: Reported on 12/19/2023)     No current facility-administered medications for this visit.    Review of Systems: GENERAL: negative for malaise, night sweats HEENT: No changes in hearing or vision, no nose bleeds or other nasal problems. NECK: Negative for lumps, goiter, pain and significant neck swelling RESPIRATORY: Negative for cough, wheezing CARDIOVASCULAR: Negative for chest pain, leg swelling, palpitations, orthopnea GI: SEE HPI MUSCULOSKELETAL: Negative for joint pain or swelling, back pain, and muscle pain. SKIN: Negative  for lesions, rash PSYCH: Negative for sleep disturbance, mood disorder and recent psychosocial stressors. HEMATOLOGY Negative for prolonged bleeding, bruising easily, and swollen nodes. ENDOCRINE: Negative for cold or heat intolerance, polyuria, polydipsia and goiter. NEURO: negative for tremor, gait imbalance, syncope and seizures. The remainder of the review of systems is noncontributory.   Physical Exam: BP (!) 155/81   Pulse (!) 47   Temp 97.8 F (36.6 C)   Ht 5' 5 (1.651 m)   Wt 169 lb 11.2 oz (77 kg)   BMI 28.24 kg/m  GENERAL: The patient is AO x3, in no acute distress. HEENT: Head is normocephalic and atraumatic. EOMI are intact. Mouth is well hydrated and without lesions. NECK: Supple. No masses LUNGS: Clear to auscultation. No presence of rhonchi/wheezing/rales. Adequate chest expansion HEART: RRR, normal s1 and s2. ABDOMEN: Soft, nontender, no guarding, no peritoneal signs, and nondistended. BS +. No masses. EXTREMITIES: Without any cyanosis, clubbing, rash, lesions or edema. NEUROLOGIC:  AOx3, no focal motor deficit. SKIN: no jaundice, no rashes   Imaging/Labs: as above  I personally reviewed and interpreted the available labs, imaging and endoscopic files.  Impression and Plan: JIANNI SHELDEN is a 63 y.o. female with PMH HTN, who presents for evaluation of recent episode of colitis.  The patient  scented an acute episode of abdominal pain with bloody diarrhea in the setting of syncope and hypotension, likely related to ischemic colitis.  She presented significant improvement of her symptoms with antibiotic coverage.  Denies having any complaints at the moment and reports feeling well, although there is some abdominal discomfort at the moment.  We discussed in detail with the patient the importance of evaluating for her episode of colitis to rule out other etiologies, for which she will need to proceed with a colonoscopy after mid August.  Patient is agreeable to proceed with this.  She can keep taking MiraLAX on a regular basis to keep her stools soft and she will be started on Bentyl  as needed for abdominal pain.  I emphasized the importance of avoiding hypotension to avoid recurrent episodes of ischemic colitis.  -Continue MiraLAX to keep stools soft -Schedule colonoscopy after the third week of August -Follow closely with PCP to maintain an adequate heart rate and blood pressure -Start Bentyl  1 tablet q12h as needed for abdominal pain  All questions were answered.      Toribio Fortune, MD Gastroenterology and Hepatology [facility] Gastroenterology

## 2023-12-19 NOTE — H&P (View-Only) (Signed)
 Toribio Fortune, M.D. Gastroenterology & Hepatology [facility] Gastroenterology [address] [place], [place] [address] Primary Care Physician: Shona Norleen PEDLAR, MD [address] [place] [address]  Referring MD: PCP  Chief Complaint: Colitis  History of Present Illness: Tammy Carter is a 63 y.o. female with PMH HTN, who presents for evaluation of recent episode of colitis.  Patient was admitted to the hospital on 11/23/2023 after presenting severe acute abdominal pain in her mid abdomen. The patient denied having any nausea, vomiting, fever, chills, melena, hematemesis, abdominal distention, abdominal pain, diarrhea, jaundice, pruritus or weight loss. Labs showed normal CBC, CMP with sodium 134, otherwise was within normal limits.  FOBT was positive.  GI pathogen panel and ova and parasite were negative.  CT of the abdomen pelvis with IV contrast showed long segment wall thickening and hyperenhancement of the ascending, sigmoid, and rectum.  CT angio of the abdomen showed normal patency of the vasculature of the abdomen.  Patient was given an empiric course with ceftriaxone  and metronidazole  with resolution of symptoms. She was discharged on oral antibiotics. Of note, she had a syncopal episode due to hypotension and bradycardia, which was the attributed cause for ischemic colitis.  Notably, when she was hospitalized, she noticed a change in her stools as she had bloody diarrhea in multiple episodes.  The patient reports that her symptoms have improved, but she is still presenting abdominal pain in her left flank, migrating to the right side of there abdomen. She is currently having 3 Bms per day as she is taking Miralax (was having some slight constipation after she left the hospital). She denies having any nausea, vomiting, fever, chills, melena, hematemesis, abdominal distention, abdominal pain, jaundice, pruritus. She has lost weight on purpose by changing  her diet - has lost 90 lb in the last 4 years.  Does not take NSAIDs or anticoagulants.  Last ZHI:fjwb years ago, does remember when - no reports available Last Colonoscopy: Possibly in 2007, had polyps at that time.  Had a Cologuard in 2024 that was negative.  FHx: father had gastric ulcers, neg for any gastrointestinal/liver disease, no malignancies Social: neg smoking, alcohol or illicit drug use Surgical: no abdominal surgeries  Past Medical History: Past Medical History:  Diagnosis Date   Abnormal levels of other serum enzymes    ADD (attention deficit disorder)    Allergic contact dermatitis due to plants, except food    Body mass index 37.0-37.9, adult    Body mass index 38.0-38.9, adult    CAD (coronary artery disease)    Depression, major    Dyspepsia 02/23/2013   Dysphagia, unspecified(787.20) 02/23/2013   Dysuria    Encounter for general adult medical examination with abnormal findings    Essential (primary) hypertension    GERD (gastroesophageal reflux disease)    HSV infection    Hx of migraines    Hyperlipidemia    Hypertension    Hypothyroidism    Localized edema    Other fatigue    Other malaise    Other seasonal allergic rhinitis    Other specified noninflammatory disorders of vagina    Pain in throat    Palpitations    SVT (supraventricular tachycardia) (HCC)    Unspecified constipation 02/23/2013   Unspecified urinary incontinence     Past Surgical History: Past Surgical History:  Procedure Laterality Date   APPENDECTOMY     COLONOSCOPY   09/26/2005   WLM:Wnmfjo colonoscopy   ESOPHAGOGASTRODUODENOSCOPY  09/26/2005   NUR: Small gastric polyps at body otherwise normal esophagogastroduodenoscopy. Three of these polyps were biopsied for  histology. Endoscopically these appeared to be hyperplastic/ Esophagus dilated by passing 56 French Maloney dilator, empiric. benign path   PALATE SURGERY     EXPANSION    Family History: Family History  Problem  Relation Age of Onset   Heart disease Mother    Hypertension Mother    Diabetes type II Mother    Heart attack Mother 67   CAD Mother    Aneurysm Father        CEREBRAL   Hypertension Father    AAA (abdominal aortic aneurysm) Father    Valvular heart disease Father    CAD Father    Hypertension Sister    High Cholesterol Sister    Heart Problems Sister        PVC   Muscular dystrophy Brother    Heart disease Maternal Grandmother    Cancer Paternal Grandmother 49       BREAST AND COLON   Healthy Daughter    Healthy Daughter    Colon cancer Neg Hx     Social History: Social History   Tobacco Use  Smoking Status Former   Current packs/day: 0.00   Types: Cigarettes   Quit date: 04/05/1979   Years since quitting: 44.7  Smokeless Tobacco Never  Tobacco Comments   SOCIAL   Social History   Substance and Sexual Activity  Alcohol Use Yes   Alcohol/week: 1.0 - 2.0 standard drink of alcohol   Types: 1 - 2 Glasses of wine per week   Social History   Substance and Sexual Activity  Drug Use No    Allergies: Allergies  Allergen Reactions   Atorvastatin Other (See Comments)    -myalgia   Ferrous Sulfate Palpitations    Medications: Current Outpatient Medications  Medication Sig Dispense Refill   acetaminophen  (TYLENOL ) 325 MG tablet Take 2 tablets (650 mg total) by mouth every 6 (six) hours as needed for mild pain (pain score 1-3) or fever (or Fever >/= 101). 30 tablet 0   lisinopril  (ZESTRIL ) 10 MG tablet TAKE 1 TABLET BY MOUTH EVERY DAY 30 tablet 0   pantoprazole  (PROTONIX ) 40 MG tablet TAKE 1 TABLET BY MOUTH EVERY DAY 30 tablet 0   rosuvastatin  (CRESTOR ) 20 MG tablet TAKE 1 TABLET BY MOUTH EVERYDAY AT BEDTIME 30 tablet 0   [Paused] amLODipine (NORVASC) 2.5 MG tablet Take 2.5 mg by mouth daily. (Patient not taking: Reported on 12/19/2023)     Calcium -Magnesium-Vitamin D (CALCIUM  500 PO) Take 1 tablet by mouth daily. (Patient not taking: Reported on 12/19/2023)      [Paused] ferrous sulfate 325 (65 FE) MG tablet Take 325 mg by mouth daily with breakfast. (Patient not taking: Reported on 12/19/2023)     Multiple Vitamins-Minerals (VITAMIN D3 COMPLETE PO) Take 1 tablet by mouth daily. (Patient not taking: Reported on 12/19/2023)     Multiple Vitamins-Minerals (WOMENS MULTIVITAMIN PO) Take by mouth daily. (Patient not taking: Reported on 12/19/2023)     No current facility-administered medications for this visit.    Review of Systems: GENERAL: negative for malaise, night sweats HEENT: No changes in hearing or vision, no nose bleeds or other nasal problems. NECK: Negative for lumps, goiter, pain and significant neck swelling RESPIRATORY: Negative for cough, wheezing CARDIOVASCULAR: Negative for chest pain, leg swelling, palpitations, orthopnea GI: SEE HPI MUSCULOSKELETAL: Negative for joint pain or swelling, back pain, and muscle pain. SKIN: Negative  for lesions, rash PSYCH: Negative for sleep disturbance, mood disorder and recent psychosocial stressors. HEMATOLOGY Negative for prolonged bleeding, bruising easily, and swollen nodes. ENDOCRINE: Negative for cold or heat intolerance, polyuria, polydipsia and goiter. NEURO: negative for tremor, gait imbalance, syncope and seizures. The remainder of the review of systems is noncontributory.   Physical Exam: BP (!) 155/81   Pulse (!) 47   Temp 97.8 F (36.6 C)   Ht 5' 5 (1.651 m)   Wt 169 lb 11.2 oz (77 kg)   BMI 28.24 kg/m  GENERAL: The patient is AO x3, in no acute distress. HEENT: Head is normocephalic and atraumatic. EOMI are intact. Mouth is well hydrated and without lesions. NECK: Supple. No masses LUNGS: Clear to auscultation. No presence of rhonchi/wheezing/rales. Adequate chest expansion HEART: RRR, normal s1 and s2. ABDOMEN: Soft, nontender, no guarding, no peritoneal signs, and nondistended. BS +. No masses. EXTREMITIES: Without any cyanosis, clubbing, rash, lesions or edema. NEUROLOGIC:  AOx3, no focal motor deficit. SKIN: no jaundice, no rashes   Imaging/Labs: as above  I personally reviewed and interpreted the available labs, imaging and endoscopic files.  Impression and Plan: Tammy Carter is a 63 y.o. female with PMH HTN, who presents for evaluation of recent episode of colitis.  The patient  scented an acute episode of abdominal pain with bloody diarrhea in the setting of syncope and hypotension, likely related to ischemic colitis.  She presented significant improvement of her symptoms with antibiotic coverage.  Denies having any complaints at the moment and reports feeling well, although there is some abdominal discomfort at the moment.  We discussed in detail with the patient the importance of evaluating for her episode of colitis to rule out other etiologies, for which she will need to proceed with a colonoscopy after mid August.  Patient is agreeable to proceed with this.  She can keep taking MiraLAX on a regular basis to keep her stools soft and she will be started on Bentyl  as needed for abdominal pain.  I emphasized the importance of avoiding hypotension to avoid recurrent episodes of ischemic colitis.  -Continue MiraLAX to keep stools soft -Schedule colonoscopy after the third week of August -Follow closely with PCP to maintain an adequate heart rate and blood pressure -Start Bentyl  1 tablet q12h as needed for abdominal pain  All questions were answered.      Toribio Fortune, MD Gastroenterology and Hepatology [facility] Gastroenterology

## 2023-12-19 NOTE — Telephone Encounter (Signed)
 NOT [facility] PATIENT

## 2023-12-20 ENCOUNTER — Other Ambulatory Visit: Payer: Self-pay | Admitting: Student

## 2023-12-20 ENCOUNTER — Other Ambulatory Visit: Payer: Self-pay

## 2023-12-24 ENCOUNTER — Ambulatory Visit (HOSPITAL_COMMUNITY)
Admission: RE | Admit: 2023-12-24 | Discharge: 2023-12-24 | Disposition: A | Discharge status: Home / Self Care | Payer: Self-pay | Source: Ambulatory Visit

## 2023-12-24 DIAGNOSIS — I10 Essential (primary) hypertension: Secondary | ICD-10-CM | POA: Insufficient documentation

## 2024-01-14 ENCOUNTER — Other Ambulatory Visit (INDEPENDENT_AMBULATORY_CARE_PROVIDER_SITE_OTHER): Payer: Self-pay | Admitting: Gastroenterology

## 2024-01-14 DIAGNOSIS — K559 Vascular disorder of intestine, unspecified: Secondary | ICD-10-CM

## 2024-01-14 DIAGNOSIS — R109 Unspecified abdominal pain: Secondary | ICD-10-CM

## 2024-01-15 ENCOUNTER — Ambulatory Visit (HOSPITAL_COMMUNITY)
Admission: RE | Admit: 2024-01-15 | Discharge: 2024-01-15 | Disposition: A | Discharge status: Home / Self Care | Attending: Gastroenterology | Admitting: Gastroenterology

## 2024-01-15 ENCOUNTER — Ambulatory Visit (HOSPITAL_COMMUNITY): Admitting: Anesthesiology

## 2024-01-15 ENCOUNTER — Encounter (HOSPITAL_COMMUNITY)
Admission: RE | Disposition: A | Discharge status: Home / Self Care | Payer: Self-pay | Source: Home / Self Care | Attending: Gastroenterology

## 2024-01-15 ENCOUNTER — Encounter (HOSPITAL_COMMUNITY): Payer: Self-pay | Admitting: Gastroenterology

## 2024-01-15 ENCOUNTER — Other Ambulatory Visit: Payer: Self-pay

## 2024-01-15 DIAGNOSIS — K5520 Angiodysplasia of colon without hemorrhage: Secondary | ICD-10-CM

## 2024-01-15 DIAGNOSIS — Z6828 Body mass index (BMI) 28.0-28.9, adult: Secondary | ICD-10-CM | POA: Diagnosis not present

## 2024-01-15 DIAGNOSIS — I2510 Atherosclerotic heart disease of native coronary artery without angina pectoris: Secondary | ICD-10-CM | POA: Diagnosis not present

## 2024-01-15 DIAGNOSIS — K219 Gastro-esophageal reflux disease without esophagitis: Secondary | ICD-10-CM | POA: Diagnosis not present

## 2024-01-15 DIAGNOSIS — K648 Other hemorrhoids: Secondary | ICD-10-CM | POA: Diagnosis not present

## 2024-01-15 DIAGNOSIS — E039 Hypothyroidism, unspecified: Secondary | ICD-10-CM | POA: Diagnosis not present

## 2024-01-15 DIAGNOSIS — Z8249 Family history of ischemic heart disease and other diseases of the circulatory system: Secondary | ICD-10-CM | POA: Diagnosis not present

## 2024-01-15 DIAGNOSIS — K5730 Diverticulosis of large intestine without perforation or abscess without bleeding: Secondary | ICD-10-CM | POA: Diagnosis not present

## 2024-01-15 DIAGNOSIS — D122 Benign neoplasm of ascending colon: Secondary | ICD-10-CM | POA: Diagnosis not present

## 2024-01-15 DIAGNOSIS — K5521 Angiodysplasia of colon with hemorrhage: Secondary | ICD-10-CM | POA: Diagnosis not present

## 2024-01-15 DIAGNOSIS — K529 Noninfective gastroenteritis and colitis, unspecified: Secondary | ICD-10-CM | POA: Insufficient documentation

## 2024-01-15 DIAGNOSIS — I10 Essential (primary) hypertension: Secondary | ICD-10-CM | POA: Insufficient documentation

## 2024-01-15 DIAGNOSIS — Z87891 Personal history of nicotine dependence: Secondary | ICD-10-CM | POA: Insufficient documentation

## 2024-01-15 DIAGNOSIS — E66813 Obesity, class 3: Secondary | ICD-10-CM | POA: Diagnosis not present

## 2024-01-15 DIAGNOSIS — Z8719 Personal history of other diseases of the digestive system: Secondary | ICD-10-CM

## 2024-01-15 DIAGNOSIS — D120 Benign neoplasm of cecum: Secondary | ICD-10-CM | POA: Diagnosis not present

## 2024-01-15 HISTORY — PX: COLONOSCOPY: SHX5424

## 2024-01-15 LAB — HM COLONOSCOPY

## 2024-01-15 SURGERY — COLONOSCOPY
Anesthesia: General

## 2024-01-15 MED ORDER — LACTATED RINGERS IV SOLN: INTRAVENOUS | Status: DC

## 2024-01-15 MED ORDER — LIDOCAINE 2% (20 MG/ML) 5 ML SYRINGE
INTRAMUSCULAR | Status: DC | PRN
  Administered 2024-01-15: 60 mg via INTRAVENOUS

## 2024-01-15 MED ORDER — PROPOFOL 10 MG/ML IV BOLUS
INTRAVENOUS | Status: DC | PRN
  Administered 2024-01-15: 125 ug/kg/min via INTRAVENOUS
  Administered 2024-01-15: 80 mg via INTRAVENOUS

## 2024-01-15 MED ORDER — LACTATED RINGERS IV SOLN
INTRAVENOUS | Status: DC | PRN
Start: 2024-01-15 — End: 2024-01-15

## 2024-01-15 NOTE — Discharge Instructions (Signed)
 You are being discharged to home.  Resume your previous diet.  We are waiting for your pathology results.  Your physician has recommended a repeat colonoscopy for surveillance based on pathology results.

## 2024-01-15 NOTE — Transfer of Care (Addendum)
 Immediate Anesthesia Transfer of Care Note  Patient: Tammy Carter  Procedure(s) Performed: COLONOSCOPY  Patient Location: Endoscopy Unit  Anesthesia Type:General  Level of Consciousness: drowsy and patient cooperative  Airway & Oxygen Therapy: Patient Spontanous Breathing  Post-op Assessment: Report given to RN and Post -op Vital signs reviewed and stable  Post vital signs: Reviewed and stable  Last Vitals:  Vitals Value Taken Time  BP 94/53 01/15/24   1104  Temp 36.6 01/15/24   1104  Pulse 45 01/15/24   1104  Resp 12 01/15/24   1104  SpO2 100% 01/15/24   1104    Last Pain:  Vitals:   01/15/24 1026  TempSrc:   PainSc: 0-No pain      Patients Stated Pain Goal: Other (Comment) (pt denies any pain) (01/15/24 0851)  Complications: No notable events documented.

## 2024-01-15 NOTE — Interval H&P Note (Signed)
 History and Physical Interval Note:  01/15/2024 8:59 AM  Tammy Carter  has presented today for surgery, with the diagnosis of colitis.  The various methods of treatment have been discussed with the patient and family. After consideration of risks, benefits and other options for treatment, the patient has consented to  Procedure(s) with comments: COLONOSCOPY (N/A) - 10;00 am, asa 1-2 as a surgical intervention.  The patient's history has been reviewed, patient examined, no change in status, stable for surgery.  I have reviewed the patient's chart and labs.  Questions were answered to the patient's satisfaction.     Rayshun Kandler Castaneda Mayorga

## 2024-01-15 NOTE — Anesthesia Preprocedure Evaluation (Signed)
 Anesthesia Evaluation  Patient identified by MRN, date of birth, ID band Patient awake    Reviewed: Allergy & Precautions, H&P , NPO status , Patient's Chart, lab work & pertinent test results, reviewed documented beta blocker date and time   Airway Mallampati: II  TM Distance: >3 FB Neck ROM: full    Dental no notable dental hx.    Pulmonary neg pulmonary ROS, former smoker   Pulmonary exam normal breath sounds clear to auscultation       Cardiovascular Exercise Tolerance: Good hypertension, + CAD   Rhythm:regular Rate:Normal     Neuro/Psych  PSYCHIATRIC DISORDERS  Depression    negative neurological ROS     GI/Hepatic Neg liver ROS,GERD  ,,  Endo/Other  Hypothyroidism  Class 3 obesity  Renal/GU negative Renal ROS  negative genitourinary   Musculoskeletal   Abdominal   Peds  Hematology negative hematology ROS (+)   Anesthesia Other Findings   Reproductive/Obstetrics negative OB ROS                              Anesthesia Physical Anesthesia Plan  ASA: 3  Anesthesia Plan: General   Post-op Pain Management:    Induction:   PONV Risk Score and Plan: Propofol  infusion  Airway Management Planned:   Additional Equipment:   Intra-op Plan:   Post-operative Plan:   Informed Consent: I have reviewed the patients History and Physical, chart, labs and discussed the procedure including the risks, benefits and alternatives for the proposed anesthesia with the patient or authorized representative who has indicated his/her understanding and acceptance.     Dental Advisory Given  Plan Discussed with: CRNA  Anesthesia Plan Comments:         Anesthesia Quick Evaluation

## 2024-01-15 NOTE — Op Note (Signed)
 [facility] Patient Name: Tammy Carter Procedure Date: 01/15/2024 10:09 AM MRN: [id number] Date of Birth: July 06, 1960 Attending MD: Toribio Fortune , , [id number] CSN: [id number] Age: 63 Admit Type: Outpatient Procedure:                Colonoscopy Indications:              Follow-up of colitis Providers:                Toribio Fortune, Devere Lodge, Bascom Blush Referring MD:              Medicines:                Monitored Anesthesia Care Complications:            No immediate complications. Estimated Blood Loss:     Estimated blood loss: none. Procedure:                Pre-Anesthesia Assessment:                           - Prior to the procedure, a History and Physical                            was performed, and patient medications, allergies                            and sensitivities were reviewed. The patient's                            tolerance of previous anesthesia was reviewed.                           - The risks and benefits of the procedure and the                            sedation options and risks were discussed with the                            patient. All questions were answered and informed                            consent was obtained.                           - ASA Grade Assessment: II - A patient with mild                            systemic disease.                           After obtaining informed consent, the colonoscope                            was passed under direct vision. Throughout the                            procedure, the patient's blood pressure, pulse, and  oxygen saturations were monitored continuously. The                            [id number]-[id number] ([id number]) Peds Colon was introduced                            through the anus and advanced to the the cecum,                            identified by appendiceal orifice and ileocecal                            valve. The colonoscopy was performed without                             difficulty. The patient tolerated the procedure                            well. The quality of the bowel preparation was                            excellent. Scope In: 10:31:27 AM Scope Out: 10:57:54 AM Scope Withdrawal Time: 0 hours 18 minutes 38 seconds  Total Procedure Duration: 0 hours 26 minutes 27 seconds  Findings:      The perianal and digital rectal examinations were normal.      Three small localized angiodysplastic lesions without bleeding were       found in the transverse colon and in the ascending colon.      Two sessile polyps were found in the ascending colon and cecum. The       polyps were 1 to 2 mm in size. These polyps were removed with a cold       biopsy forceps. Resection and retrieval were complete.      A 4 mm polyp was found in the ascending colon. The polyp was sessile.       The polyp was removed with a cold snare. Resection and retrieval were       complete.      Scattered large-mouthed and small-mouthed diverticula were found in the       sigmoid colon, descending colon and ascending colon.      Non-bleeding internal hemorrhoids were found during retroflexion. The       hemorrhoids were small. Impression:               - Three non-bleeding colonic angiodysplastic                            lesions.                           - Two 1 to 2 mm polyps in the ascending colon and                            in the cecum, removed with a cold biopsy forceps.  Resected and retrieved.                           - One 4 mm polyp in the ascending colon, removed                            with a cold snare. Resected and retrieved.                           - Diverticulosis in the sigmoid colon, in the                            descending colon and in the ascending colon.                           - Non-bleeding internal hemorrhoids. Moderate Sedation:      Per Anesthesia Care Recommendation:           - Discharge  patient to home (ambulatory).                           - Resume previous diet.                           - Await pathology results.                           - Repeat colonoscopy for surveillance based on                            pathology results. Procedure Code(s):        --- Professional ---                           [phone], Colonoscopy, flexible; with removal of                            tumor(s), polyp(s), or other lesion(s) by snare                            technique                           45380, 59, Colonoscopy, flexible; with biopsy,                            single or multiple Diagnosis Code(s):        --- Professional ---                           K55.20, Angiodysplasia of colon without hemorrhage                           D12.2, Benign neoplasm of ascending colon                           D12.0, Benign neoplasm of cecum  K64.8, Other hemorrhoids                           K52.9, Noninfective gastroenteritis and colitis,                            unspecified                           K57.30, Diverticulosis of large intestine without                            perforation or abscess without bleeding CPT copyright 2022 American Medical Association. All rights reserved. The codes documented in this report are preliminary and upon coder review may  be revised to meet current compliance requirements. Toribio Fortune, MD Toribio Fortune,  01/15/2024 11:06:00 AM This report has been signed electronically. Number of Addenda: 0

## 2024-01-15 NOTE — Anesthesia Procedure Notes (Signed)
 Date/Time: 01/15/2024 10:27 AM  Performed by: Para Jerelene CROME, CRNAOxygen Delivery Method: Nasal cannula

## 2024-01-16 ENCOUNTER — Encounter (HOSPITAL_COMMUNITY): Payer: Self-pay | Admitting: Gastroenterology

## 2024-01-16 ENCOUNTER — Ambulatory Visit: Payer: Self-pay | Admitting: Gastroenterology

## 2024-01-16 LAB — SURGICAL PATHOLOGY

## 2024-01-16 NOTE — Anesthesia Postprocedure Evaluation (Signed)
 Anesthesia Post Note  Patient: Tammy Carter  Procedure(s) Performed: COLONOSCOPY  Patient location during evaluation: Phase II Anesthesia Type: General Level of consciousness: awake Pain management: pain level controlled Vital Signs Assessment: post-procedure vital signs reviewed and stable Respiratory status: spontaneous breathing and respiratory function stable Cardiovascular status: blood pressure returned to baseline and stable Postop Assessment: no headache and no apparent nausea or vomiting Anesthetic complications: no Comments: Late entry   No notable events documented.   Last Vitals:  Vitals:   01/15/24 1104 01/15/24 1109  BP: (!) 94/53 116/61  Pulse: (!) 45 (!) 52  Resp: 12 14  Temp: 36.6 C   SpO2: 100% 100%    Last Pain:  Vitals:   01/15/24 1104  TempSrc: Oral  PainSc: 0-No pain                 Yvonna JINNY Bosworth

## 2024-01-23 ENCOUNTER — Encounter (INDEPENDENT_AMBULATORY_CARE_PROVIDER_SITE_OTHER): Payer: Self-pay | Admitting: *Deleted

## 2024-01-27 ENCOUNTER — Encounter (INDEPENDENT_AMBULATORY_CARE_PROVIDER_SITE_OTHER): Payer: Self-pay | Admitting: *Deleted

## 2024-01-27 ENCOUNTER — Other Ambulatory Visit: Payer: Self-pay | Admitting: Student

## 2024-01-27 NOTE — Progress Notes (Signed)
 3 yr TCS noted in recall Patient result letter mailed procedure note and pathology result faxed to PCP

## 2024-01-28 ENCOUNTER — Telehealth (INDEPENDENT_AMBULATORY_CARE_PROVIDER_SITE_OTHER): Payer: Self-pay | Admitting: Gastroenterology

## 2024-01-28 NOTE — Telephone Encounter (Signed)
 Pt daughter called in and states that pt had TCS 01/15/24 and has began to notice clearish-pale discharge from rectal area. Noticed discharge this week. Pt daughter states no smell, no blood. Pt is wanting to know if this is normal. Please advise. Thank you!!

## 2024-01-28 NOTE — Telephone Encounter (Signed)
 Not [facility] patient

## 2024-01-29 NOTE — Telephone Encounter (Signed)
 Spoke with the patient's daughter regarding the patient's symptoms.  States that she had discharge once a few days ago.  No more episodes of this since then.  Has not had any other complaints.  I advised her to monitor for now but if she were to have more symptoms she needs to notify us  and possibly collect a sample of the discharge.

## 2024-01-29 NOTE — Telephone Encounter (Signed)
 Noted

## 2024-02-12 ENCOUNTER — Other Ambulatory Visit (INDEPENDENT_AMBULATORY_CARE_PROVIDER_SITE_OTHER): Payer: Self-pay | Admitting: Gastroenterology

## 2024-02-12 ENCOUNTER — Telehealth (INDEPENDENT_AMBULATORY_CARE_PROVIDER_SITE_OTHER): Payer: Self-pay

## 2024-02-12 DIAGNOSIS — K649 Unspecified hemorrhoids: Secondary | ICD-10-CM

## 2024-02-12 MED ORDER — HYDROCORTISONE ACETATE 25 MG RE SUPP: 25.0000 mg | Freq: Two times a day (BID) | RECTAL | 1 refills | Status: DC

## 2024-02-12 NOTE — Telephone Encounter (Signed)
 I advised the patient to start using Anusol  suppositories twice a day for a week and she she will also increase her MiraLAX to 3 capfuls per day.  If she is not having any improvement in 2 weeks, she will need to call back to let us  know that she we will need to proceed with hemorrhoidal banding at that time.

## 2024-02-12 NOTE — Telephone Encounter (Signed)
 Noted,  Thank you!

## 2024-02-12 NOTE — Telephone Encounter (Signed)
 Patient daughter Altamese called today states the patient has been having issues with constipation and hemorrhoids,rectal pain, bright red blood per rectum. Patient states she saw bright red blood, with mucus in it on tissue today when she went to have a Bm. Patient has been using prep h, this has helped some with the the rectal pain. She says she has been having this issue for the past two weeks. Patient had Colonoscopy done by  Dr. Eartha on 01/15/2024, which at the time had some large non bleeding internal hemorrhoids. Patient had been on Miralax once per day,but did not see any advantage of taking this so she has tried to manage constipation with her diet by increasing vegetable in diet. Patient did have a bm yesterday. Denies any fevers, abdominal pain. Patient uses [facility]. Please advise.

## 2024-02-14 ENCOUNTER — Emergency Department (HOSPITAL_COMMUNITY)
Admission: EM | Admit: 2024-02-14 | Discharge: 2024-02-14 | Disposition: A | Discharge status: Home / Self Care | Attending: Emergency Medicine | Admitting: Emergency Medicine

## 2024-02-14 DIAGNOSIS — K6289 Other specified diseases of anus and rectum: Secondary | ICD-10-CM

## 2024-02-14 DIAGNOSIS — K648 Other hemorrhoids: Secondary | ICD-10-CM | POA: Insufficient documentation

## 2024-02-14 NOTE — ED Provider Notes (Signed)
 [facility] EMERGENCY DEPARTMENT AT [facility]  Provider Note  CSN: [id number] Arrival date & time: 02/14/24 0010  History Chief Complaint  Patient presents with   Rectal Pain   History via Video Interpreter  Tammy Carter is a 63 y.o. female here with daughter for evaluation of rectal pain. She had an admission for colitis in July and subsequent colonoscopy on Aug 27 showing benign polyps and non bleeding internal hemorrhoids. No IBD found. She reports since then she has had constipation and rectal pain. She has been advised to take anusol  and miralax by GI but has not been back to their office yet. She reports tonight she began having some foul smelling rectal discharge and increased pain. No fevers. No abdominal pain. She has been having regular BM including two earlier tonight.    Home Medications Prior to Admission medications   Medication Sig Start Date End Date Taking? Authorizing Provider  hydrocortisone  (ANUSOL -HC) 25 MG suppository Place 1 suppository (25 mg total) rectally 2 (two) times daily. 02/12/24   Castaneda Mayorga, Daniel, MD  acetaminophen  (TYLENOL ) 325 MG tablet Take 2 tablets (650 mg total) by mouth every 6 (six) hours as needed for mild pain (pain score 1-3) or fever (or Fever >/= 101). 11/26/23   Rihner, Emilie, DO  amLODipine (NORVASC) 2.5 MG tablet Take 2.5 mg by mouth daily. Patient not taking: No sig reported    [provider]  Calcium -Magnesium-Vitamin D (CALCIUM  500 PO) Take 1 tablet by mouth daily. Patient not taking: Reported on 12/19/2023    [provider]  dicyclomine  (BENTYL ) 10 MG capsule TAKE 1 CAPSULE (10 MG TOTAL) BY MOUTH EVERY 12 (TWELVE) HOURS AS NEEDED. 01/14/24   Eartha Flavors, Toribio, MD  ferrous sulfate 325 (65 FE) MG tablet Take 325 mg by mouth daily with breakfast. Patient not taking: Reported on 12/19/2023    [provider]  lisinopril  (ZESTRIL ) 10 MG tablet TAKE 1 TABLET BY MOUTH EVERY DAY 11/27/23    Elnora Ip, MD  Multiple Vitamins-Minerals (VITAMIN D3 COMPLETE PO) Take 1 tablet by mouth daily. Patient not taking: Reported on 12/19/2023    [provider]  Multiple Vitamins-Minerals (WOMENS MULTIVITAMIN PO) Take by mouth daily. Patient not taking: Reported on 12/19/2023    [provider]  pantoprazole  (PROTONIX ) 40 MG tablet TAKE 1 TABLET BY MOUTH EVERY DAY 11/27/23   Elnora Ip, MD  rosuvastatin  (CRESTOR ) 20 MG tablet TAKE 1 TABLET BY MOUTH EVERYDAY AT BEDTIME 11/27/23   Elnora Ip, MD     Allergies    Atorvastatin and Ferrous sulfate   Review of Systems   Review of Systems Please see HPI for pertinent positives and negatives  Physical Exam BP (!) 193/94   Pulse 67   Temp (!) 97.4 F (36.3 C) (Oral)   Resp 19   Ht 5' 5 (1.651 m)   Wt 77.1 kg   SpO2 98%   BMI 28.29 kg/m   Physical Exam Vitals and nursing note reviewed. Exam conducted with a chaperone present.  HENT:     Head: Normocephalic.     Nose: Nose normal.  Eyes:     Extraocular Movements: Extraocular movements intact.  Pulmonary:     Effort: Pulmonary effort is normal.  Genitourinary:    Comments: No external hemorrhoids, no discharge, no palpable masses or tenderness with DRE, no stool in rectum and no bleeding Musculoskeletal:        General: Normal range of motion.  Cervical back: Neck supple.  Skin:    Findings: No rash (on exposed skin).  Neurological:     Mental Status: She is alert and oriented to person, place, and time.  Psychiatric:        Mood and Affect: Mood normal.     ED Results / Procedures / Treatments   EKG None  Procedures Procedures  Medications Ordered in the ED Medications - No data to display  Initial Impression and Plan  Patient with known internal hemorrhoids here with continued rectal pain and reported foul smelling discharge. Her rectal exam here is neg. No signs of infection or abscess. No indication for  additional ED workup tonight. Recommend she continue with anusol  and miralax and follow up with GI for further management. RTED for any other concerns.   ED Course       MDM Rules/Calculators/A&P Medical Decision Making Problems Addressed: Internal hemorrhoids: chronic illness or injury Rectal pain: acute illness or injury  Risk OTC drugs.     Final Clinical Impression(s) / ED Diagnoses Final diagnoses:  Rectal pain  Internal hemorrhoids    Rx / DC Orders ED Discharge Orders     None        Roselyn Carlin NOVAK, MD 02/14/24 [phone]

## 2024-02-14 NOTE — ED Triage Notes (Signed)
 Pt arrives via POV from home with c/o constipation and abnormal smelling rectal discharge with scant blood. Family report colonoscopy done on 27th and began having discharge a week after. Pt concerned for constipation/hemorrhoids/ and infection in discharge. Pt states normal BM earlier this evening. Spanish speaking only.

## 2024-02-14 NOTE — ED Notes (Signed)
 Rectal exam completed by Dr Roselyn with this RN as witness/assist.

## 2024-02-14 NOTE — ED Notes (Signed)
 ED Provider at bedside.

## 2024-02-18 ENCOUNTER — Ambulatory Visit (INDEPENDENT_AMBULATORY_CARE_PROVIDER_SITE_OTHER): Admitting: Gastroenterology

## 2024-02-18 VITALS — BP 172/69 | HR 60 | Temp 97.7°F | Ht 65.0 in | Wt 175.3 lb

## 2024-02-18 DIAGNOSIS — K625 Hemorrhage of anus and rectum: Secondary | ICD-10-CM | POA: Diagnosis not present

## 2024-02-18 DIAGNOSIS — K6289 Other specified diseases of anus and rectum: Secondary | ICD-10-CM

## 2024-02-18 DIAGNOSIS — K559 Vascular disorder of intestine, unspecified: Secondary | ICD-10-CM

## 2024-02-18 DIAGNOSIS — K649 Unspecified hemorrhoids: Secondary | ICD-10-CM

## 2024-02-18 DIAGNOSIS — R109 Unspecified abdominal pain: Secondary | ICD-10-CM

## 2024-02-18 MED ORDER — HYDROCORTISONE ACETATE 25 MG RE SUPP: 25.0000 mg | Freq: Two times a day (BID) | RECTAL | 1 refills | Status: AC

## 2024-02-18 NOTE — Patient Instructions (Signed)
-  start anusol  suppositories -avoid straining, limit toilet time -continue miralax three times per day -consider hemorrhoid banding if hemorrhoids persist -Increase water intake, aim for atleast 64 oz per day -Increase fruits, veggies and whole grains, kiwi and prunes are especially good for constipation  Follow up 6 weeks  It was a pleasure to see you today. I want to create trusting relationships with patients and provide genuine, compassionate, and quality care. I truly value your feedback! please be on the lookout for a survey regarding your visit with me today. I appreciate your input about our visit and your time in completing this!    Taiwo Fish L. Geral Coker, MSN, APRN, AGNP-C Adult-Gerontology Nurse Practitioner [facility]

## 2024-02-18 NOTE — Progress Notes (Addendum)
 Referring Provider: Shona Norleen PEDLAR, MD Primary Care Physician:  Shona Norleen PEDLAR, MD Primary GI Physician: Dr. Eartha   Chief Complaint  Patient presents with   Constipation    Was told to do Miralax 3 x daily by provider.  Pt was having discharge from rectum and went to hospital last week. Was told had internal hemorrhoids.   HPI:   Tammy Carter is a 63 y.o. female with past medical history of HTN, colitis   Patient presenting today for:  rectal pain and bleeding  Last seen July by Dr. Eartha, at that time had recent admission in early July with abdominal pain, CT with  CT of the abdomen pelvis with IV contrast showed long segment wall thickening and hyperenhancement of the ascending, sigmoid, and rectum.  CT angio of the abdomen showed normal patency of the vasculature of the abdomen.  Patient was given an empiric course with ceftriaxone  and metronidazole  with resolution of symptoms. Symptoms had improved but still having some pain at last OV. 3 Bms per day on miralax.   Recommended continue miralax, schedule colonoscopy, bentyl  q12h PRN  Of note patient called on 9/24 c/o hemorrhoids with rectal bleeding, she was sent anusol  BID x1 week and advised to increase miralax to 3 capfuls per day. Consider hemorrhoid banding if hemorrhoids do not improve  Patient was seen in the ED on 9/26 for rectal pain and discharge, she was advised to continue with miralax and anusol  as rectal exam was unremarkable   Present:  Daughter helps to interpret for the visit. She reports that a few days after the colonoscopy patient had some rectal pain and discharge. This seemed to resolve but last week she had more constipation and developed some hemorrhoids, she then saw some rectal discharge and bleeding. States they tried preparation h which did not provide much improvement. She called here and was told to do miralax TID and sent anusol . On Friday she was concerned about an infection and went to the ED,  as outlined above. They state the pharmacy never got the anusol . The constipation is better with miralax but she continues to have rectal pain, she feels something protruding out of her rectum when she wipes. She notes that rectal bleeding has resolved. She does report that she had some pain in her Left flank on the day she went to the ER, she also reports feeling inflamed with some swelling to her face and feet as well. She endorses some pain in L flank last night but no one today. She has used dicyclomine  which has been helpful.     Last Colonoscopy:01/15/24 - Three non-bleeding colonic angiodysplastic                            lesions.                           - Two 1 to 2 mm polyps in the ascending colon and                            in the cecum, removed with a cold biopsy forceps.                            Resected and retrieved.                           -  One 4 mm polyp in the ascending colon, removed                            with a cold snare. Resected and retrieved.                           - Diverticulosis in the sigmoid colon, in the                            descending colon and in the ascending colon.                           - Non-bleeding internal hemorrhoids. A. COLON POLYPS, CECUM AND ASCENDING; POLYPECTOMY:  - Tubular adenoma (multiple fragments).  - Negative for high-grade dysplasia and malignancy.  Last Endoscopy:  Recommendations:  Repeat TCS 3 years    Past Medical History:  Diagnosis Date   Abnormal levels of other serum enzymes    ADD (attention deficit disorder)    Allergic contact dermatitis due to plants, except food    Body mass index 37.0-37.9, adult    Body mass index 38.0-38.9, adult    CAD (coronary artery disease)    Depression, major    Dyspepsia 02/23/2013   Dysphagia, unspecified(787.20) 02/23/2013   Dysuria    Encounter for general adult medical examination with abnormal findings    Essential (primary) hypertension    GERD  (gastroesophageal reflux disease)    HSV infection    Hx of migraines    Hyperlipidemia    Hypertension    Hypothyroidism    Localized edema    Other fatigue    Other malaise    Other seasonal allergic rhinitis    Other specified noninflammatory disorders of vagina    Pain in throat    Palpitations    SVT (supraventricular tachycardia)    Unspecified constipation 02/23/2013   Unspecified urinary incontinence     Past Surgical History:  Procedure Laterality Date   APPENDECTOMY     COLONOSCOPY   09/26/2005   [facility]:Wnmfjo colonoscopy   COLONOSCOPY N/A 01/15/2024   Procedure: COLONOSCOPY;  Surgeon: Eartha Angelia Sieving, MD;  Location: [facility];  Service: Gastroenterology;  Laterality: N/A;  10;00 am, asa 1-2   ESOPHAGOGASTRODUODENOSCOPY  09/26/2005   NUR: Small gastric polyps at body otherwise normal esophagogastroduodenoscopy. Three of these polyps were biopsied for  histology. Endoscopically these appeared to be hyperplastic/ Esophagus dilated by passing 56 French Maloney dilator, empiric. benign path   PALATE SURGERY     EXPANSION    Current Outpatient Medications  Medication Sig Dispense Refill   acetaminophen  (TYLENOL ) 325 MG tablet Take 2 tablets (650 mg total) by mouth every 6 (six) hours as needed for mild pain (pain score 1-3) or fever (or Fever >/= 101). 30 tablet 0   dicyclomine  (BENTYL ) 10 MG capsule TAKE 1 CAPSULE (10 MG TOTAL) BY MOUTH EVERY 12 (TWELVE) HOURS AS NEEDED. 180 capsule 1   hydrocortisone  (ANUSOL -HC) 25 MG suppository Place 1 suppository (25 mg total) rectally 2 (two) times daily. 14 suppository 1   lisinopril  (ZESTRIL ) 10 MG tablet TAKE 1 TABLET BY MOUTH EVERY DAY 30 tablet 0   pantoprazole  (PROTONIX ) 40 MG tablet TAKE 1 TABLET BY MOUTH EVERY DAY 30 tablet 0   rosuvastatin  (CRESTOR ) 20 MG tablet TAKE 1 TABLET BY  MOUTH EVERYDAY AT BEDTIME 30 tablet 0   amLODipine (NORVASC) 2.5 MG tablet Take 2.5 mg by mouth daily. (Patient not taking: Reported on  02/18/2024)     Calcium -Magnesium-Vitamin D (CALCIUM  500 PO) Take 1 tablet by mouth daily. (Patient not taking: Reported on 02/18/2024)     ferrous sulfate 325 (65 FE) MG tablet Take 325 mg by mouth daily with breakfast. (Patient not taking: Reported on 02/18/2024)     Multiple Vitamins-Minerals (VITAMIN D3 COMPLETE PO) Take 1 tablet by mouth daily. (Patient not taking: Reported on 02/18/2024)     Multiple Vitamins-Minerals (WOMENS MULTIVITAMIN PO) Take by mouth daily. (Patient not taking: Reported on 02/18/2024)     No current facility-administered medications for this visit.    Allergies as of 02/18/2024 - Review Complete 02/18/2024  Allergen Reaction Noted   Atorvastatin Other (See Comments) 04/04/2018   Ferrous sulfate Palpitations 11/23/2023    Social History   Socioeconomic History   Marital status: Married    Spouse name: Not on file   Number of children: 5   Years of education: Not on file   Highest education level: Not on file  Occupational History   Not on file  Tobacco Use   Smoking status: Former    Current packs/day: 0.00    Types: Cigarettes    Quit date: 04/05/1979    Years since quitting: 44.9   Smokeless tobacco: Never   Tobacco comments:    SOCIAL  Vaping Use   Vaping status: Never Used  Substance and Sexual Activity   Alcohol use: Yes    Alcohol/week: 1.0 - 2.0 standard drink of alcohol    Types: 1 - 2 Glasses of wine per week   Drug use: No   Sexual activity: Not on file    Comment: MARRIED  Other Topics Concern   Not on file  Social History Narrative   Not on file   Social Drivers of Health   Financial Resource Strain: Not on file  Food Insecurity: No Food Insecurity (11/23/2023)   Hunger Vital Sign    Worried About Running Out of Food in the Last Year: Never true    Ran Out of Food in the Last Year: Never true  Transportation Needs: No Transportation Needs (11/23/2023)   PRAPARE - Administrator, Civil Service (Medical): No    Lack of  Transportation (Non-Medical): No  Physical Activity: Not on file  Stress: Not on file  Social Connections: Not on file    Review of systems General: negative for malaise, night sweats, fever, chills, weight loss Neck: Negative for lumps, goiter, pain and significant neck swelling Resp: Negative for cough, wheezing, dyspnea at rest CV: Negative for chest pain, leg swelling, palpitations, orthopnea GI: denies melena, hematochezia, nausea, vomiting, diarrhea, constipation, dysphagia, odyonophagia, early satiety or unintentional weight loss. +rectal pain +rectal bleeding  MSK: Negative for joint pain or swelling, back pain, and muscle pain. Derm: Negative for itching or rash Psych: Denies depression, anxiety, memory loss, confusion. No homicidal or suicidal ideation.  Heme: Negative for prolonged bleeding, bruising easily, and swollen nodes. Endocrine: Negative for cold or heat intolerance, polyuria, polydipsia and goiter. Neuro: negative for tremor, gait imbalance, syncope and seizures. The remainder of the review of systems is noncontributory.  Physical Exam: BP (!) 172/69 (BP Location: Left Arm, Patient Position: Sitting, Cuff Size: Normal)   Pulse 60   Temp 97.7 F (36.5 C) (Oral)   Ht 5' 5 (1.651 m)   Wt 175  lb 4.8 oz (79.5 kg)   BMI 29.17 kg/m  General:   Alert and oriented. No distress noted. Pleasant and cooperative.  Head:  Normocephalic and atraumatic. Eyes:  Conjuctiva clear without scleral icterus. Mouth:  Oral mucosa pink and moist. Good dentition. No lesions. Heart: Normal rate and rhythm, s1 and s2 heart sounds present.  Lungs: Clear lung sounds in all lobes. Respirations equal and unlabored. Abdomen:  +BS, soft, non-tender and non-distended. No rebound or guarding. No HSM or masses noted. Rectal: tammy radford, LPN present as witness. No obvious lesions, masses, fissures or hemorrhoids. DRE with normal sphincter tone. Anoscope utilized, appears to have some small,  inflamed, non thrombosed hemorrhoids present. No discharge or bleeding noted. Derm: No palmar erythema or jaundice Msk:  Symmetrical without gross deformities. Normal posture. Extremities:  Without edema. Neurologic:  Alert and  oriented x4 Psych:  Alert and cooperative. Normal mood and affect.  Invalid input(s): 6 MONTHS   ASSESSMENT: Tammy Carter is a 63 y.o. female presenting today for rectal pain and bleeding  Recent TCS In August as outlined above. She did have hemorrhoids present on exam. Reports rectal pain and bleeding after significant episode of constipation. Using miralax TID which has resolved her constipation. Bleeding has stopped, she continues to have some rectal pain. Did not get anusol  suppositories as it appears they were sent to the wrong pharmacy. On rectal exam, no obvious masses or lesions, anoscope used, appears to have some small, inflamed internally located hemorrhoids. At this time, would recommend continuing miralax TID, avoid straining and limit toilet time, use anusol  suppositories BID (resent to current pharmacy) and consider hemorrhoid banding if symptoms persisting. I did discuss hemorrhoid banding with patient and daughter and provided a brochure as well. Patient's daughter inquired about her exercising and helping with patient's husband who is disabled. No strict contraindications to exercise, as especially as she does not do any heavy weight lifting, she should continue to utilize body safe movements when assisting her husband as she has been doing.    PLAN:  -start anusol  suppositories BID -avoid straining, limit toilet time -continue miralax TID -consider hemorrhoid banding if hemorrhoids persist -Increase water intake, aim for atleast 64 oz per day -Increase fruits, veggies and whole grains, kiwi and prunes are especially good for constipation -continue dicyclomine  Q12h PRN  All questions were answered, patient verbalized understanding and is in  agreement with plan as outlined above.    Follow Up: 1 month  Johnnie Moten L. Mariette, MSN, APRN, AGNP-C Adult-Gerontology Nurse Practitioner [facility]  I have reviewed the note and agree with the APP's assessment as described in this progress note  Toribio Fortune, MD Gastroenterology and Hepatology [facility]

## 2024-02-27 ENCOUNTER — Ambulatory Visit (INDEPENDENT_AMBULATORY_CARE_PROVIDER_SITE_OTHER): Admitting: Gastroenterology

## 2024-03-02 ENCOUNTER — Ambulatory Visit (INDEPENDENT_AMBULATORY_CARE_PROVIDER_SITE_OTHER): Admitting: Gastroenterology

## 2024-03-04 ENCOUNTER — Encounter (INDEPENDENT_AMBULATORY_CARE_PROVIDER_SITE_OTHER): Payer: Self-pay | Admitting: Gastroenterology

## 2024-03-30 ENCOUNTER — Encounter (INDEPENDENT_AMBULATORY_CARE_PROVIDER_SITE_OTHER): Payer: Self-pay | Admitting: Gastroenterology

## 2024-03-30 ENCOUNTER — Ambulatory Visit (INDEPENDENT_AMBULATORY_CARE_PROVIDER_SITE_OTHER): Admitting: Gastroenterology

## 2024-03-30 VITALS — BP 185/98 | HR 51 | Temp 97.1°F | Ht 65.0 in | Wt 172.1 lb

## 2024-03-30 DIAGNOSIS — L290 Pruritus ani: Secondary | ICD-10-CM | POA: Diagnosis not present

## 2024-03-30 DIAGNOSIS — K649 Unspecified hemorrhoids: Secondary | ICD-10-CM | POA: Diagnosis not present

## 2024-03-30 DIAGNOSIS — K5904 Chronic idiopathic constipation: Secondary | ICD-10-CM

## 2024-03-30 MED ORDER — NYSTATIN-TRIAMCINOLONE 100000-0.1 UNIT/GM-% EX OINT: 1.0000 | TOPICAL_OINTMENT | Freq: Two times a day (BID) | CUTANEOUS | 0 refills | Status: AC

## 2024-03-30 NOTE — Progress Notes (Addendum)
 Referring Provider: Shona Norleen PEDLAR, MD Primary Care Physician:  Shona Norleen PEDLAR, MD Primary GI Physician: Dr. Eartha   Chief Complaint  Patient presents with   Follow-up    Patient here today for a follow up on her issues with Hemorrhoids, which patient says is much better. Patient was given Anusol  suppositories at the last ov, which she has finished. She is taking miralax TID. She is not taking any fiber supplements currently. Patient denies any sight of dark or bloody stools, rectal pain. She does have some issues with rectal itching.    HPI:   Tammy Carter is a 63 y.o. female with past medical history of HTN, colitis   Patient presenting today for:  Follow up hemorrhoids, constipation and with pruritus ani  Last seen 9/30, at that time had some rectal pain and discharge after TCS which then resolved but she developed more constipation and hemorrhoids thereafter, recurrence of rectal bleeding thereafter. Using preparation H without improvement, was sent anusol  but states they never got this, feels tissue protruding from rectum when wiping.   Recommended anusol  TID, avoid straining, limit toilet time, Miralax TID, consider hemorrhoid banding if symptoms persist, continue dicyclomine  Q12h prn  Present:  Rectal pain has resolved. She notes some rectal itching at times that began after she used the anusol , this is occurring almost daily. She notes it can occur at any time even outside of having BMs. Not using anything currently for itching. She denies any rectal burning. She notes she is not using hemorrhoid cream anymore. She is doing miralax TID, having a BM 2-3 times per day that are easy to pass. She denies any abdominal pain or rectal bleeding.   Last Colonoscopy:01/15/24 - Three non-bleeding colonic angiodysplastic                            lesions.                           - Two 1 to 2 mm polyps in the ascending colon and                            in the cecum, removed with a  cold biopsy forceps.                            Resected and retrieved.                           - One 4 mm polyp in the ascending colon, removed                            with a cold snare. Resected and retrieved.                           - Diverticulosis in the sigmoid colon, in the                            descending colon and in the ascending colon.                           - Non-bleeding internal  hemorrhoids. A. COLON POLYPS, CECUM AND ASCENDING; POLYPECTOMY:  - Tubular adenoma (multiple fragments).  - Negative for high-grade dysplasia and malignancy.  Last Endoscopy:    Recommendations:  Repeat TCS 3 years   Past Medical History:  Diagnosis Date   Abnormal levels of other serum enzymes    ADD (attention deficit disorder)    Allergic contact dermatitis due to plants, except food    Body mass index 37.0-37.9, adult    Body mass index 38.0-38.9, adult    CAD (coronary artery disease)    Depression, major    Dyspepsia 02/23/2013   Dysphagia, unspecified(787.20) 02/23/2013   Dysuria    Encounter for general adult medical examination with abnormal findings    Essential (primary) hypertension    GERD (gastroesophageal reflux disease)    HSV infection    Hx of migraines    Hyperlipidemia    Hypertension    Hypothyroidism    Localized edema    Other fatigue    Other malaise    Other seasonal allergic rhinitis    Other specified noninflammatory disorders of vagina    Pain in throat    Palpitations    SVT (supraventricular tachycardia)    Unspecified constipation 02/23/2013   Unspecified urinary incontinence     Past Surgical History:  Procedure Laterality Date   APPENDECTOMY     COLONOSCOPY   09/26/2005   [facility] colonoscopy   COLONOSCOPY N/A 01/15/2024   Procedure: COLONOSCOPY;  Surgeon: Eartha Angelia Sieving, MD;  Location: [facility];  Service: Gastroenterology;  Laterality: N/A;  10;00 am, asa 1-2   ESOPHAGOGASTRODUODENOSCOPY  09/26/2005   NUR: Small  gastric polyps at body otherwise normal esophagogastroduodenoscopy. Three of these polyps were biopsied for  histology. Endoscopically these appeared to be hyperplastic/ Esophagus dilated by passing 56 French Maloney dilator, empiric. benign path   PALATE SURGERY     EXPANSION    Current Outpatient Medications  Medication Sig Dispense Refill   amLODipine (NORVASC) 2.5 MG tablet Take 2.5 mg by mouth daily. (Patient taking differently: Take 5 mg by mouth daily.)     dicyclomine  (BENTYL ) 10 MG capsule TAKE 1 CAPSULE (10 MG TOTAL) BY MOUTH EVERY 12 (TWELVE) HOURS AS NEEDED. 180 capsule 1   loratadine (CLARITIN) 10 MG tablet Take 10 mg by mouth daily.     hydrocortisone  (ANUSOL -HC) 25 MG suppository Place 1 suppository (25 mg total) rectally 2 (two) times daily. (Patient not taking: Reported on 03/30/2024) 14 suppository 1   lisinopril  (ZESTRIL ) 10 MG tablet TAKE 1 TABLET BY MOUTH EVERY DAY (Patient not taking: Reported on 03/30/2024) 30 tablet 0   No current facility-administered medications for this visit.    Allergies as of 03/30/2024 - Review Complete 03/30/2024  Allergen Reaction Noted   Atorvastatin Other (See Comments) 04/04/2018   Ferrous sulfate Palpitations 11/23/2023    Social History   Socioeconomic History   Marital status: Married    Spouse name: Not on file   Number of children: 5   Years of education: Not on file   Highest education level: Not on file  Occupational History   Not on file  Tobacco Use   Smoking status: Former    Current packs/day: 0.00    Types: Cigarettes    Quit date: 04/05/1979    Years since quitting: 45.0   Smokeless tobacco: Never   Tobacco comments:    SOCIAL  Vaping Use   Vaping status: Never Used  Substance and Sexual Activity   Alcohol use:  Yes    Alcohol/week: 1.0 - 2.0 standard drink of alcohol    Types: 1 - 2 Glasses of wine per week   Drug use: No   Sexual activity: Not on file    Comment: MARRIED  Other Topics Concern   Not  on file  Social History Narrative   Not on file   Social Drivers of Health   Financial Resource Strain: Not on file  Food Insecurity: No Food Insecurity (11/23/2023)   Hunger Vital Sign    Worried About Running Out of Food in the Last Year: Never true    Ran Out of Food in the Last Year: Never true  Transportation Needs: No Transportation Needs (11/23/2023)   PRAPARE - Administrator, Civil Service (Medical): No    Lack of Transportation (Non-Medical): No  Physical Activity: Not on file  Stress: Not on file  Social Connections: Not on file    Review of systems General: negative for malaise, night sweats, fever, chills, weight loss Neck: Negative for lumps, goiter, pain and significant neck swelling Resp: Negative for cough, wheezing, dyspnea at rest CV: Negative for chest pain, leg swelling, palpitations, orthopnea GI: denies melena, hematochezia, nausea, vomiting, diarrhea, constipation, dysphagia, odyonophagia, early satiety or unintentional weight loss. +rectal pruritus  MSK: Negative for joint pain or swelling, back pain, and muscle pain. Derm: Negative for itching or rash Psych: Denies depression, anxiety, memory loss, confusion. No homicidal or suicidal ideation.  Heme: Negative for prolonged bleeding, bruising easily, and swollen nodes. Endocrine: Negative for cold or heat intolerance, polyuria, polydipsia and goiter. Neuro: negative for tremor, gait imbalance, syncope and seizures. The remainder of the review of systems is noncontributory.  Physical Exam: BP (!) 185/98 (BP Location: Left Arm, Patient Position: Sitting, Cuff Size: Normal)   Pulse (!) 51   Temp (!) 97.1 F (36.2 C) (Temporal)   Ht 5' 5 (1.651 m)   Wt 172 lb 1.6 oz (78.1 kg)   BMI 28.64 kg/m  General:   Alert and oriented. No distress noted. Pleasant and cooperative.  Head:  Normocephalic and atraumatic. Eyes:  Conjuctiva clear without scleral icterus. Mouth:  Oral mucosa pink and moist.  Good dentition. No lesions. Heart: Normal rate and rhythm, s1 and s2 heart sounds present.  Lungs: Clear lung sounds in all lobes. Respirations equal and unlabored. Abdomen:  +BS, soft, non-tender and non-distended. No rebound or guarding. No HSM or masses noted. Rectal: crystal sutton CMA present as witness, no DRE done given recent rectal exam and colonoscopy. No obvious external masses or lesions, some mild erythema of perianal area, suspect candida  Derm: No palmar erythema or jaundice Msk:  Symmetrical without gross deformities. Normal posture. Extremities:  Without edema. Neurologic:  Alert and  oriented x4 Psych:  Alert and cooperative. Normal mood and affect.  Invalid input(s): 6 MONTHS   ASSESSMENT: Tammy Carter is a 63 y.o. female presenting today for follow up of hemorrhoids, constipation and with pruritus ani   Patient with previous hemorrhoid flare, constipation after recent TCS. Symptoms have resolved with use of anusol  and constipation. She denies any further rectal bleeding or pain. Having 2-3 BMs per day with miralax TID. She notes some rectal itching since using anusol . On rectal exam, some mild peri anal erythema, suspect this is candida in setting of recent anusol  use. Recommend continuing miralax TID, avoid use of wet wipes, keep rectal area clean, dry, use warm wash rag to clean up, avoid harsh soaps. Will send  mycolog ointment to use BID x10 days.    PLAN:  -continue miralax TID -avoid wet wipes, harsh soaps in rectal area -mycolog ointment BID x10 days -Increase water intake, aim for atleast 64 oz per day -Increase fruits, veggies and whole grains, kiwi and prunes are especially good for constipation  All questions were answered, patient verbalized understanding and is in agreement with plan as outlined above.   Follow Up: 6 months   Roen Macgowan L. Mariette, MSN, APRN, AGNP-C Adult-Gerontology Nurse Practitioner [facility]  I have  reviewed the note and agree with the APP's assessment as described in this progress note  Toribio Fortune, MD Gastroenterology and Hepatology [facility], [place] [facility]

## 2024-03-30 NOTE — Patient Instructions (Signed)
 I'm glad you are feeling better Please continue miralax three times per day I have sent mycolog ointment to apply to rectal area twice daily x10 days Keep rectal area clean and dry, avoid wet wipes, try to clean up with warm water only Make me aware if itching is not improving  Follow up 6 months  It was a pleasure to see you today. I want to create trusting relationships with patients and provide genuine, compassionate, and quality care. I truly value your feedback! please be on the lookout for a survey regarding your visit with me today. I appreciate your input about our visit and your time in completing this!    Tammy Carter L. Cire Clute, MSN, APRN, AGNP-C Adult-Gerontology Nurse Practitioner [facility] at [facility]
